# Patient Record
Sex: Female | Born: 1965 | Race: White | Hispanic: No | Marital: Married | Smoking: Never smoker
Health system: Southern US, Community
[De-identification: ages and names within clinical notes are randomized; demographics above are authoritative.]

## PROBLEM LIST (undated history)

## (undated) DIAGNOSIS — L509 Urticaria, unspecified: Secondary | ICD-10-CM

## (undated) DIAGNOSIS — F419 Anxiety disorder, unspecified: Secondary | ICD-10-CM

## (undated) DIAGNOSIS — N390 Urinary tract infection, site not specified: Secondary | ICD-10-CM

## (undated) DIAGNOSIS — G930 Cerebral cysts: Secondary | ICD-10-CM

## (undated) DIAGNOSIS — E119 Type 2 diabetes mellitus without complications: Secondary | ICD-10-CM

## (undated) DIAGNOSIS — N809 Endometriosis, unspecified: Secondary | ICD-10-CM

## (undated) DIAGNOSIS — G2581 Restless legs syndrome: Secondary | ICD-10-CM

## (undated) DIAGNOSIS — I10 Essential (primary) hypertension: Secondary | ICD-10-CM

## (undated) DIAGNOSIS — T7840XA Allergy, unspecified, initial encounter: Secondary | ICD-10-CM

## (undated) HISTORY — PX: COMBINED HYSTEROSCOPY DIAGNOSTIC / D&C: SUR297

## (undated) HISTORY — PX: CERVICAL ABLATION: SHX5771

## (undated) HISTORY — DX: Endometriosis, unspecified: N80.9

## (undated) HISTORY — DX: Urinary tract infection, site not specified: N39.0

## (undated) HISTORY — PX: DILATION AND CURETTAGE OF UTERUS: SHX78

## (undated) HISTORY — DX: Anxiety disorder, unspecified: F41.9

## (undated) HISTORY — DX: Essential (primary) hypertension: I10

## (undated) HISTORY — PX: NOVASURE ABLATION: SHX5394

## (undated) HISTORY — DX: Allergy, unspecified, initial encounter: T78.40XA

## (undated) HISTORY — DX: Cerebral cysts: G93.0

## (undated) HISTORY — PX: LAPAROSCOPIC ABDOMINAL EXPLORATION: SHX6249

## (undated) HISTORY — PX: OTHER SURGICAL HISTORY: SHX169

## (undated) HISTORY — DX: Type 2 diabetes mellitus without complications: E11.9

## (undated) HISTORY — DX: Restless legs syndrome: G25.81

## (undated) HISTORY — DX: Urticaria, unspecified: L50.9

---

## 2013-05-31 ENCOUNTER — Ambulatory Visit: Payer: BC Managed Care – PPO | Admitting: Physician Assistant

## 2013-05-31 VITALS — BP 118/72 | HR 82 | Temp 98.2°F | Resp 16 | Ht 65.0 in | Wt 251.0 lb

## 2013-05-31 DIAGNOSIS — R300 Dysuria: Secondary | ICD-10-CM

## 2013-05-31 DIAGNOSIS — R350 Frequency of micturition: Secondary | ICD-10-CM

## 2013-05-31 LAB — POCT UA - MICROSCOPIC ONLY
Crystals, Ur, HPF, POC: NEGATIVE
Yeast, UA: NEGATIVE

## 2013-05-31 LAB — POCT URINALYSIS DIPSTICK
Bilirubin, UA: NEGATIVE
Blood, UA: NEGATIVE
Glucose, UA: 500
Leukocytes, UA: NEGATIVE
Nitrite, UA: POSITIVE
Protein, UA: NEGATIVE
Spec Grav, UA: 1.02
Urobilinogen, UA: 0.2
pH, UA: 5.5

## 2013-05-31 MED ORDER — FLUCONAZOLE 150 MG PO TABS: 150.0000 mg | ORAL_TABLET | Freq: Once | ORAL | Status: DC

## 2013-05-31 MED ORDER — CIPROFLOXACIN HCL 500 MG PO TABS: 500.0000 mg | ORAL_TABLET | Freq: Two times a day (BID) | ORAL | Status: DC

## 2013-05-31 NOTE — Progress Notes (Signed)
  Subjective:    Patient ID: April Carson, female    DOB: 1966-01-27, 47 y.o.   MRN: [id number]  HPI 47 year old female presents with 3-4 day history of urinary frequency and slight right sided flank pain. She does have a significant hx of frequent UTI's in the past. Has recently given up soda which has helped decrease the frequency of her infections (last about 4 months ago).  Denies nausea, vomiting, fever, or chills. Complains of slight vaginal discharge - believes she may have a yeast infection. Has been taking Invokana and she has been told she this is a side effect. Otherwise she is tolerating it well.  Patient is from [place] - here for the next 2 months taking care of her father who is sick.      Review of Systems  Constitutional: Negative for fever and chills.  Gastrointestinal: Negative for nausea, vomiting and abdominal pain.  Genitourinary: Positive for dysuria, frequency and vaginal discharge. Negative for hematuria, flank pain, vaginal bleeding, vaginal pain and menstrual problem.       Objective:   Physical Exam  Constitutional: She is oriented to person, place, and time. She appears well-developed and well-nourished.  HENT:  Head: Normocephalic and atraumatic.  Right Ear: External ear normal.  Left Ear: External ear normal.  Eyes: Conjunctivae are normal.  Neck: Normal range of motion.  Cardiovascular: Normal rate, regular rhythm and normal heart sounds.   Pulmonary/Chest: Effort normal and breath sounds normal.  Abdominal: Soft. Bowel sounds are normal. There is no tenderness. There is no rebound, no guarding and no CVA tenderness.  Neurological: She is alert and oriented to person, place, and time.  Psychiatric: She has a normal mood and affect. Her behavior is normal. Judgment and thought content normal.     Results for orders placed in visit on 05/31/13  POCT URINALYSIS DIPSTICK      Result Value Range   Color, UA yellow     Clarity, UA clear     Glucose,  UA 500     Bilirubin, UA neg     Ketones, UA trace     Spec Grav, UA 1.020     Blood, UA neg     pH, UA 5.5     Protein, UA neg     Urobilinogen, UA 0.2     Nitrite, UA pos     Leukocytes, UA Negative    POCT UA - MICROSCOPIC ONLY      Result Value Range   WBC, Ur, HPF, POC 0-2     RBC, urine, microscopic 0-3     Bacteria, U Microscopic 3+     Mucus, UA trace     Epithelial cells, urine per micros 1-5     Crystals, Ur, HPF, POC neg     Casts, Ur, LPF, POC broad waxy     Yeast, UA neg          Assessment & Plan:  Dysuria - Plan: POCT urinalysis dipstick, POCT UA - Microscopic Only, ciprofloxacin (CIPRO) 500 MG tablet, Urine culture  Urinary frequency  Urine culture sent Start Cipro 500 mg bid x 5 days Increase fluids and rest. Try to decrease soda intake Follow up if symptoms worsen or fail to improve.

## 2013-06-02 LAB — URINE CULTURE: Colony Count: >=100000

## 2013-07-27 ENCOUNTER — Telehealth: Payer: Self-pay | Admitting: Family Medicine

## 2013-07-27 NOTE — Telephone Encounter (Signed)
Patient is April Carson & April Carson daughter, and she would like to know if you could see her as a new patient sooner than the first available appt on 09/20/13 at 9:45am.  Please advise.

## 2013-07-28 NOTE — Telephone Encounter (Signed)
 appointment when possible, prefer not Monday or Friday.  Thanks.

## 2013-07-28 NOTE — Telephone Encounter (Signed)
I spoke with patient and scheduled appointment on Thursday,Aug.21st at 11:00.

## 2013-08-03 ENCOUNTER — Ambulatory Visit: Payer: BC Managed Care – PPO | Admitting: Emergency Medicine

## 2013-08-03 VITALS — BP 124/66 | HR 80 | Temp 98.7°F | Resp 18 | Ht 64.5 in | Wt 237.0 lb

## 2013-08-03 DIAGNOSIS — J45909 Unspecified asthma, uncomplicated: Secondary | ICD-10-CM

## 2013-08-03 DIAGNOSIS — T148XXA Other injury of unspecified body region, initial encounter: Secondary | ICD-10-CM

## 2013-08-03 LAB — POCT CBC
Granulocyte percent: 73.3 %G (ref 37–80)
HCT, POC: 44.8 % (ref 37.7–47.9)
Hemoglobin: 13.9 g/dL (ref 12.2–16.2)
Lymph, poc: 3 (ref 0.6–3.4)
MCH, POC: 27.9 pg (ref 27–31.2)
MCHC: 31 g/dL — AB (ref 31.8–35.4)
MCV: 89.8 fL (ref 80–97)
MID (cbc): 0.5 (ref 0–0.9)
MPV: 8.2 fL (ref 0–99.8)
POC Granulocyte: 9.7 — AB (ref 2–6.9)
POC LYMPH PERCENT: 22.7 % (ref 10–50)
POC MID %: 4 %M (ref 0–12)
Platelet Count, POC: 297 10*3/uL (ref 142–424)
RBC: 4.99 M/uL (ref 4.04–5.48)
RDW, POC: 15.5 %
WBC: 13.2 10*3/uL — AB (ref 4.6–10.2)

## 2013-08-03 MED ORDER — FLUTICASONE-SALMETEROL 250-50 MCG/DOSE IN AEPB: 1.0000 | INHALATION_SPRAY | Freq: Two times a day (BID) | RESPIRATORY_TRACT | Status: DC
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
## 2013-08-05 ENCOUNTER — Ambulatory Visit: Payer: Self-pay | Admitting: Family Medicine

## 2013-08-12 ENCOUNTER — Encounter: Payer: Self-pay | Admitting: Family Medicine

## 2013-08-12 ENCOUNTER — Ambulatory Visit (INDEPENDENT_AMBULATORY_CARE_PROVIDER_SITE_OTHER): Payer: BC Managed Care – PPO | Admitting: Family Medicine

## 2013-08-12 VITALS — BP 116/66 | HR 82 | Temp 98.6°F | Wt 236.5 lb

## 2013-08-12 DIAGNOSIS — E119 Type 2 diabetes mellitus without complications: Secondary | ICD-10-CM

## 2013-08-12 DIAGNOSIS — Z808 Family history of malignant neoplasm of other organs or systems: Secondary | ICD-10-CM

## 2013-08-12 DIAGNOSIS — I10 Essential (primary) hypertension: Secondary | ICD-10-CM

## 2013-08-12 DIAGNOSIS — N889 Noninflammatory disorder of cervix uteri, unspecified: Secondary | ICD-10-CM

## 2013-08-12 DIAGNOSIS — N760 Acute vaginitis: Secondary | ICD-10-CM

## 2013-08-12 DIAGNOSIS — F411 Generalized anxiety disorder: Secondary | ICD-10-CM

## 2013-08-12 DIAGNOSIS — G2581 Restless legs syndrome: Secondary | ICD-10-CM

## 2013-08-12 DIAGNOSIS — J45909 Unspecified asthma, uncomplicated: Secondary | ICD-10-CM

## 2013-08-12 LAB — HEMOGLOBIN A1C: Hgb A1c MFr Bld: 6.3 % (ref 4.6–6.5)

## 2013-08-12 MED ORDER — ROPINIROLE HCL 1 MG PO TABS: 1.0000 mg | ORAL_TABLET | Freq: Two times a day (BID) | ORAL | Status: DC

## 2013-08-12 MED ORDER — LISINOPRIL 10 MG PO TABS: 10.0000 mg | ORAL_TABLET | Freq: Every day | ORAL | Status: DC

## 2013-08-12 MED ORDER — ALBUTEROL SULFATE HFA 108 (90 BASE) MCG/ACT IN AERS: 2.0000 | INHALATION_SPRAY | Freq: Four times a day (QID) | RESPIRATORY_TRACT | Status: DC | PRN

## 2013-08-12 MED ORDER — FLUCONAZOLE 150 MG PO TABS: 150.0000 mg | ORAL_TABLET | Freq: Once | ORAL | Status: DC

## 2013-08-12 NOTE — Patient Instructions (Signed)
Go to the lab on the way out.  We'll contact you with your lab report.  I would get a flu shot each fall.   April Carson will call about your referrals ([facility], [facility]).  Use the albuterol as needed, continue the advair twice a day.  We'll request your records.

## 2013-08-13 ENCOUNTER — Encounter: Payer: Self-pay | Admitting: Family Medicine

## 2013-08-13 DIAGNOSIS — Z808 Family history of malignant neoplasm of other organs or systems: Secondary | ICD-10-CM | POA: Insufficient documentation

## 2013-08-13 DIAGNOSIS — I10 Essential (primary) hypertension: Secondary | ICD-10-CM | POA: Insufficient documentation

## 2013-08-13 DIAGNOSIS — J45909 Unspecified asthma, uncomplicated: Secondary | ICD-10-CM | POA: Insufficient documentation

## 2013-08-13 DIAGNOSIS — N760 Acute vaginitis: Secondary | ICD-10-CM | POA: Insufficient documentation

## 2013-08-13 DIAGNOSIS — F411 Generalized anxiety disorder: Secondary | ICD-10-CM | POA: Insufficient documentation

## 2013-08-13 DIAGNOSIS — G2581 Restless legs syndrome: Secondary | ICD-10-CM | POA: Insufficient documentation

## 2013-08-13 DIAGNOSIS — N889 Noninflammatory disorder of cervix uteri, unspecified: Secondary | ICD-10-CM | POA: Insufficient documentation

## 2013-08-13 DIAGNOSIS — E119 Type 2 diabetes mellitus without complications: Secondary | ICD-10-CM | POA: Insufficient documentation

## 2013-08-13 NOTE — Assessment & Plan Note (Signed)
Controlled, continue current meds.   

## 2013-08-13 NOTE — Assessment & Plan Note (Signed)
Refer to derm  ?

## 2013-08-13 NOTE — Assessment & Plan Note (Signed)
-   Continue SSRI 

## 2013-08-13 NOTE — Progress Notes (Signed)
Diabetes:  Using medications without difficulties:yes but with vaginal yeast sx with current meds.   Hypoglycemic episodes:no Hyperglycemic episodes:no Feet problems:no Blood Sugars averaging: controlled if diet adherent Due for A1c.   H/o anxiety controlled with SSRI.  No ADE.   RLS controlled with current meds. No ADE.   FH melanoma, wanted derm referral. No new skin lesion.   H/o cervical ablation, requesting gyn referral.   Hypertension:    Using medication without problems or lightheadedness: yes Chest pain with exertion:no Edema:no Short of breath:see below  H/o mold and dust exposure. Recently started on advair for chest tightness with environmental exposures.  Trying to get mold/dust cleaned up in the house.  No h/o asthma.   PMH and SH reviewed  Meds, vitals, and allergies reviewed.   ROS: See HPI.  Otherwise negative.    GEN: nad, alert and oriented HEENT: mucous membranes moist NECK: supple w/o LA CV: rrr. PULM: ctab, no inc wob ABD: soft, +bs EXT: no edema SKIN: no acute rash  Diabetic foot exam: Normal inspection No skin breakdown No calluses  Normal DP pulses Normal sensation to light touch and monofilament Nails normal

## 2013-08-13 NOTE — Assessment & Plan Note (Signed)
See notes on labs, requesting records.

## 2013-08-13 NOTE — Assessment & Plan Note (Signed)
Continue current meds, requesting records.  

## 2013-08-13 NOTE — Assessment & Plan Note (Signed)
Diflucan for now, d/w pt about limiting sugar intake.

## 2013-08-13 NOTE — Assessment & Plan Note (Signed)
Refer, requesting records.

## 2013-08-13 NOTE — Assessment & Plan Note (Signed)
saba prn, continue advair for now.  She is working to get the house cleaned up.

## 2013-08-20 ENCOUNTER — Encounter: Payer: Self-pay | Admitting: Family Medicine

## 2013-08-23 ENCOUNTER — Ambulatory Visit (INDEPENDENT_AMBULATORY_CARE_PROVIDER_SITE_OTHER): Payer: BC Managed Care – PPO | Admitting: Family Medicine

## 2013-08-23 ENCOUNTER — Encounter: Payer: Self-pay | Admitting: Family Medicine

## 2013-08-23 VITALS — BP 122/70 | HR 68 | Temp 98.0°F | Wt 236.5 lb

## 2013-08-23 DIAGNOSIS — R930 Abnormal findings on diagnostic imaging of skull and head, not elsewhere classified: Secondary | ICD-10-CM

## 2013-08-23 DIAGNOSIS — R9089 Other abnormal findings on diagnostic imaging of central nervous system: Secondary | ICD-10-CM
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
## 2013-08-23 NOTE — Patient Instructions (Addendum)
Rest for now.  Limit activity, reading, noise, bright lights, etc.  We'll get your records and then likely order the MRI.  Take care.

## 2013-08-24 DIAGNOSIS — R9089 Other abnormal findings on diagnostic imaging of central nervous system: Secondary | ICD-10-CM | POA: Insufficient documentation

## 2013-08-24 DIAGNOSIS — M791 Myalgia, unspecified site: Secondary | ICD-10-CM | POA: Insufficient documentation

## 2013-08-24 NOTE — Assessment & Plan Note (Addendum)
Requesting records, we may need MRI after records reviewed. >25 min spent with face to face with patient, >50% counseling and/or coordinating care.

## 2013-08-24 NOTE — Assessment & Plan Note (Signed)
No LOC, requesting records.  Back pain appears to have benign source and should improve.

## 2013-08-25 ENCOUNTER — Telehealth: Payer: Self-pay | Admitting: Family Medicine

## 2013-08-25 DIAGNOSIS — R9389 Abnormal findings on diagnostic imaging of other specified body structures: Secondary | ICD-10-CM

## 2013-08-25 NOTE — Telephone Encounter (Signed)
Unable to reach patient by telephone. Spoke to patient's husband and he will contact her and have her call the office Thursday morning.

## 2013-08-25 NOTE — Telephone Encounter (Signed)
Please call pt.  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  This needs extra imaging, MRI with contrast.  Assuming this is a cyst, then it isn't cancerous.  However, it would still be reasonable to have her see neurosurgery after the CT is done.   I want their input on follow up, ie should this be re-imaged later on.  I also want them to comment on if she would need any treatment for it now.    I put in the referral for the MRI and neurosurgery.   

## 2013-08-25 NOTE — Telephone Encounter (Signed)
Please call pt.  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  This needs extra imaging, MRI with contrast.  Assuming this is a cyst, then it isn't cancerous.  However, it would still be reasonable to have her see neurosurgery after the CT is done.   I want their input on follow up, ie should this be re-imaged later on.  I also want them to comment on if she would need any treatment for it now.    I put in the referral for the MRI and neurosurgery.

## 2013-08-26 NOTE — Telephone Encounter (Signed)
Patient advised.

## 2013-08-29 ENCOUNTER — Encounter: Payer: Self-pay | Admitting: Family Medicine

## 2013-08-29 ENCOUNTER — Telehealth: Payer: Self-pay | Admitting: Family Medicine

## 2013-08-29 NOTE — Telephone Encounter (Signed)
Call pt. I got old records. I didn't see anything about her last Pap, Tetanus, PNA vaccine, or eye exam.  We can address those as we go along.  Thanks.

## 2013-08-30 ENCOUNTER — Encounter: Payer: BC Managed Care – PPO | Admitting: Obstetrics & Gynecology

## 2013-08-30 NOTE — Telephone Encounter (Signed)
MRI isn't emergent. Would push back the MRI until she is back in town.  Have her see if she can reschedule.  I wish her and her daughter well.  Thanks .

## 2013-08-30 NOTE — Telephone Encounter (Signed)
Left detailed message on voicemail.  

## 2013-08-30 NOTE — Telephone Encounter (Signed)
Patient notified as instructed by telephone. Was advised that she is at [facility] with her daughter and will be there probably at least thru Wednesday. Patient wants to know if she should try to have the MRI at [facility] or is it okay to wait until she is back in town to have the test. Patient states that she is scheduled Wednesday for the MRI. Please advise.

## 2013-09-01 ENCOUNTER — Other Ambulatory Visit: Payer: BC Managed Care – PPO

## 2013-09-02 ENCOUNTER — Encounter: Payer: Self-pay | Admitting: Family Medicine

## 2013-09-02 ENCOUNTER — Ambulatory Visit (INDEPENDENT_AMBULATORY_CARE_PROVIDER_SITE_OTHER): Payer: BC Managed Care – PPO | Admitting: Family Medicine

## 2013-09-02 VITALS — BP 124/64 | HR 70 | Temp 98.4°F | Wt 234.2 lb

## 2013-09-02 DIAGNOSIS — R9089 Other abnormal findings on diagnostic imaging of central nervous system: Secondary | ICD-10-CM

## 2013-09-02 DIAGNOSIS — R930 Abnormal findings on diagnostic imaging of skull and head, not elsewhere classified: Secondary | ICD-10-CM

## 2013-09-02 DIAGNOSIS — Z23 Encounter for immunization: Secondary | ICD-10-CM

## 2013-09-02 NOTE — Patient Instructions (Addendum)
I have heard good things about Genene Churn (chiropractor in [place]) [phone] Ice/heat/gentle stretching/ibuprofen with food.  This should improve.  Take care.
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
## 2013-09-03 NOTE — Assessment & Plan Note (Signed)
D/w pt.  Fu MRI and neurosurg ref pending .

## 2013-09-03 NOTE — Assessment & Plan Note (Signed)
With muscle pain likely worsened again after sleeping in a chair at the hospital.  D/w pt about heat, ice, stretching, chiropractor and ibuprofen.  Fu prn.  Should resolve.

## 2013-09-05 ENCOUNTER — Ambulatory Visit
Admission: RE | Admit: 2013-09-05 | Discharge: 2013-09-05 | Disposition: A | Discharge status: Home / Self Care | Payer: Medicaid Other | Source: Ambulatory Visit | Attending: Family Medicine | Admitting: Family Medicine

## 2013-09-05 DIAGNOSIS — R9389 Abnormal findings on diagnostic imaging of other specified body structures: Secondary | ICD-10-CM

## 2013-09-05 MED ORDER — GADOBENATE DIMEGLUMINE 529 MG/ML IV SOLN
20.0000 mL | Freq: Once | INTRAVENOUS | Status: AC | PRN
  Administered 2013-09-05: 20 mL via INTRAVENOUS

## 2013-09-20 ENCOUNTER — Ambulatory Visit: Payer: Self-pay | Admitting: Family Medicine

## 2013-09-24 ENCOUNTER — Other Ambulatory Visit: Payer: Self-pay

## 2013-09-24 DIAGNOSIS — Z1231 Encounter for screening mammogram for malignant neoplasm of breast: Secondary | ICD-10-CM

## 2013-10-05 ENCOUNTER — Ambulatory Visit
Admission: RE | Admit: 2013-10-05 | Discharge: 2013-10-05 | Disposition: A | Discharge status: Home / Self Care | Payer: Medicaid Other | Source: Ambulatory Visit

## 2013-10-05 DIAGNOSIS — Z1231 Encounter for screening mammogram for malignant neoplasm of breast: Secondary | ICD-10-CM

## 2013-10-14 LAB — HM DIABETES EYE EXAM

## 2013-10-19 ENCOUNTER — Encounter: Payer: Self-pay | Admitting: Family Medicine

## 2013-11-01 ENCOUNTER — Other Ambulatory Visit: Payer: Self-pay

## 2013-11-01 MED ORDER — FLUOXETINE HCL 20 MG PO CAPS: 20.0000 mg | ORAL_CAPSULE | Freq: Every day | ORAL | Status: DC

## 2013-11-01 NOTE — Telephone Encounter (Signed)
Pt left note requesting refill fluoxetine 20 mg; pt has not had filled since Dr Rudell Cobb from [facility] 09/17/13 [facility] [address]. Pt said does not need today.

## 2013-11-01 NOTE — Telephone Encounter (Signed)
Sent!

## 2013-12-28 ENCOUNTER — Encounter: Payer: Self-pay | Admitting: Family Medicine

## 2013-12-28 ENCOUNTER — Ambulatory Visit (INDEPENDENT_AMBULATORY_CARE_PROVIDER_SITE_OTHER): Payer: BC Managed Care – PPO | Admitting: Family Medicine

## 2013-12-28 VITALS — BP 124/72 | HR 80 | Temp 98.4°F | Wt 224.0 lb

## 2013-12-28 DIAGNOSIS — R350 Frequency of micturition: Secondary | ICD-10-CM | POA: Insufficient documentation

## 2013-12-28 DIAGNOSIS — R42 Dizziness and giddiness: Secondary | ICD-10-CM | POA: Insufficient documentation

## 2013-12-28 LAB — POCT URINALYSIS DIPSTICK
Bilirubin, UA: NEGATIVE
Glucose, UA: 1000
Ketones, UA: NEGATIVE
Leukocytes, UA: NEGATIVE
Nitrite, UA: NEGATIVE
Spec Grav, UA: 1.025
Urobilinogen, UA: 0.2
pH, UA: 6

## 2013-12-28 NOTE — Assessment & Plan Note (Signed)
UA/micro negative for infection. Glucosuria attributed to invokana.

## 2013-12-28 NOTE — Assessment & Plan Note (Addendum)
Anticipate peripheral cause of vertigo given exam today - anticipate BPPV and provided with modified epley to do at home. Recommended dramamine to use prior to plane ride but pt aware trip may increase risk of recurrence. Exam not consistent with central cause today - doubt cyst related - but advised pt to update us if not improving with above.

## 2013-12-28 NOTE — Patient Instructions (Signed)
I don't think this is due to the colloid cyst in your foramin of monroe. I think this is more likely due to benign positional vertigo (BPV). May use dramamine as needed and stretching exercises provided today to help decrease frequency of vertigo. Let us know if not improving with this.

## 2013-12-28 NOTE — Progress Notes (Signed)
Pre-visit discussion using our clinic review tool. No additional management support is needed unless otherwise documented below in the visit note.  

## 2013-12-28 NOTE — Progress Notes (Signed)
   Subjective:    Patient ID: April Carson, female    DOB: Apr 25, 1966, 48 y.o.   MRN: [id number]  HPI CC: dizziness  Presents 15 min late with mother to appt today.  5d h/o feeling very dizzy - describes vertigo sensation that lasted for several hours (minutes at a time until perfectly still) - stayed in bed all day.  Next day also very dizzy.  Slowly improving.  + nausea with this and chills. No significant headache, no vomiting, hearing loss, tinnitus.  No ear pain. No dyspnea, chest pain or palpitations No recent cold sxs.   Did have allergy flare with cleaning out parent's house recently.  Planning plane ride tomorrow - wanted to be checked out priro to visit. 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  Saw Dr Para Marchuncan then neurology for this -decided against surgery.  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  Also having increase urgency, frequency and dysuria.  Mild flank pain.  No hematuria (but has longstanding history of microhematuria with negative evaluation per patient), fevers/chills.  Father with h/o meniere's dz  Lab Results  Component Value Date   HGBA1C 6.3 08/12/2013    Past Medical History  Diagnosis Date  . Diabetes mellitus without complication   . Hypertension   . RLS (restless legs syndrome)   . Allergy   . Anxiety   . Endometriosis   . Recurrent UTI     history of    Review of Systems Per HPI    Objective:   Physical Exam  Vitals reviewed. Constitutional: She appears well-developed and well-nourished. No distress.  HENT:  Head: Normocephalic and atraumatic.  Right Ear: Hearing, tympanic membrane, external ear and ear canal normal.  Left Ear: Hearing, tympanic membrane, external ear and ear canal normal.  Nose: Right sinus exhibits no maxillary sinus tenderness and no frontal sinus tenderness. Left sinus exhibits no maxillary sinus tenderness and no frontal sinus tenderness.  Mouth/Throat: Uvula is midline,  oropharynx is clear and moist and mucous membranes are normal. No oropharyngeal exudate, posterior oropharyngeal edema, posterior oropharyngeal erythema or tonsillar abscesses.  Eyes: Conjunctivae and EOM are normal. Pupils are equal, round, and reactive to light. No scleral icterus.  Neck: Normal range of motion. Neck supple. No JVD present. No thyromegaly present.  Cardiovascular: Normal rate, regular rhythm, normal heart sounds and intact distal pulses.   No murmur heard. Pulmonary/Chest: Effort normal and breath sounds normal. No respiratory distress. She has no wheezes. She has no rales.  Abdominal: Soft. Bowel sounds are normal. She exhibits no distension and no mass. There is no hepatosplenomegaly. There is no tenderness. There is no rebound, no guarding and no CVA tenderness.  Lymphadenopathy:    She has no cervical adenopathy.  Neurological: No cranial nerve deficit or sensory deficit. Coordination and gait normal.  CN 2-12 intact Intact FTN Slight vertigo without significant nystagmus with dix hallpike to right, normal dix hallpike to left  Skin: Skin is warm and dry. No rash noted.       Assessment & Plan:

## 2014-01-07 ENCOUNTER — Encounter: Payer: Self-pay | Admitting: Family Medicine

## 2014-01-07 ENCOUNTER — Ambulatory Visit (INDEPENDENT_AMBULATORY_CARE_PROVIDER_SITE_OTHER): Payer: BC Managed Care – PPO | Admitting: Family Medicine

## 2014-01-07 VITALS — BP 126/76 | HR 96 | Temp 98.4°F | Wt 224.5 lb

## 2014-01-07 DIAGNOSIS — H6980 Other specified disorders of Eustachian tube, unspecified ear: Secondary | ICD-10-CM

## 2014-01-07 DIAGNOSIS — H6990 Unspecified Eustachian tube disorder, unspecified ear: Secondary | ICD-10-CM

## 2014-01-07 MED ORDER — AMOXICILLIN-POT CLAVULANATE 875-125 MG PO TABS: 1.0000 | ORAL_TABLET | Freq: Two times a day (BID) | ORAL | Status: DC

## 2014-01-07 NOTE — Progress Notes (Signed)
Pre-visit discussion using our clinic review tool. No additional management support is needed unless otherwise documented below in the visit note.  Prev seen, note reviewed.  Vertigo resolved.  Able to fly to [place] w/o troubles.  Flying to [place] in 6 days.  ~2 days of ear pain, stuffy.  B pain, L>R.  HA, frontal.  No FNAVD, occ chills. Some rhinorrhea, more stuffy.  Some cough, mild.  Bloody nasal discharge. Sugar has been controlled.   Meds, vitals, and allergies reviewed.   ROS: See HPI.  Otherwise, noncontributory.  GEN: nad, alert and oriented HEENT: mucous membranes moist, tm w/o erythema, nasal exam w/o erythema, clear discharge noted,  OP with cobblestoning, poor TM movement on valsalva NECK: supple w/o LA CV: rrr.   PULM: ctab, no inc wob EXT: no edema SKIN: no acute rash

## 2014-01-07 NOTE — Patient Instructions (Signed)
Use an OTC decongestant and start the antibiotics in a few day if not improving or you have more facial pain. Take care.

## 2014-01-09 DIAGNOSIS — H6980 Other specified disorders of Eustachian tube, unspecified ear: Secondary | ICD-10-CM | POA: Insufficient documentation

## 2014-01-09 DIAGNOSIS — H6990 Unspecified Eustachian tube disorder, unspecified ear: Secondary | ICD-10-CM | POA: Insufficient documentation

## 2014-01-09 NOTE — Assessment & Plan Note (Signed)
No sign of AOM. Would hold abx for now, use if profound facial pain in the next few days.  Decongestants o/w in meantime, d/w pt about anatomy.  She agrees with plan. Fu prn.

## 2014-01-10 ENCOUNTER — Telehealth: Payer: Self-pay

## 2014-01-10 NOTE — Telephone Encounter (Signed)
Triage Record Num: [id number] Operator: Al CorpusKristen Binkney Patient Name: April Carson Call Date & Time: 01/08/2014 8:04:22AM Patient Phone: [phone] PCP: Crawford GivensGraham Duncan Patient Gender: Female PCP Fax : Patient DOB: [birth year] [facility] Reason for Call: Caller: [name]; PCP: Crawford Givensuncan, Graham [name] Practice); CB#: [phone]; Call regarding Right eye reddened/pink sclera with yellow/green discharge with pink sclera onset yesterday 1/23 and discharge onset today 1/24. Woke up with eyelashes stuck together. Was around a co-worker with pink eye within the last few days. Per "Eye Infection or irritation" protocol, all emergent sxs r/o w/exception of "New onset of eye redness, irritation... or gritty feeling with yellow/green discharge." Denies eye pain or severe lid redness or lid swelling or any other sxs. Disp of see provider within 24 hrs. However, able to call in script per office policy/standing orders. Called in Polytrim eye gtts--2 gtts both eyes QID x 5 days, disp #1 bottle, no RF to pts [facility]([address] in [place]) @ [phone]. Pharm not open yet this morning (opens in less than an hour), so script left on pharm vm per instructions. Pt aware script called in and also aware to call back if she develops any new or worsening sxs or if does not begin to have some relief of sxs in approx 24 hrs. Advised she would need to see provider in that case. Agreed to plan. Protocol(s) Used: Eye: Infection or Irritation Recommended Outcome per Protocol: See Provider within 24 hours Reason for Outcome: New onset of eye redness, irritation/foreign body sensation or gritty feeling with yellow/green drainage Care Advice: Call provider if symptoms get worse, or you develop increasing eye pain, changes in vision, or blisters or sores on eye or insides of eyelids. ~ ~ SYMPTOM / CONDITION MANAGEMENT ~ INFECTION CONTROL ~ CAUTIONS

## 2014-01-10 NOTE — Telephone Encounter (Signed)
Noted, thanks!

## 2014-02-07 ENCOUNTER — Encounter: Payer: Self-pay | Admitting: Internal Medicine

## 2014-02-07 ENCOUNTER — Ambulatory Visit (INDEPENDENT_AMBULATORY_CARE_PROVIDER_SITE_OTHER): Payer: BC Managed Care – PPO | Admitting: Internal Medicine

## 2014-02-07 VITALS — BP 128/76 | HR 80 | Temp 98.3°F | Wt 221.8 lb

## 2014-02-07 DIAGNOSIS — J309 Allergic rhinitis, unspecified: Secondary | ICD-10-CM

## 2014-02-07 DIAGNOSIS — R059 Cough, unspecified: Secondary | ICD-10-CM

## 2014-02-07 DIAGNOSIS — H6990 Unspecified Eustachian tube disorder, unspecified ear: Secondary | ICD-10-CM

## 2014-02-07 DIAGNOSIS — H6980 Other specified disorders of Eustachian tube, unspecified ear: Secondary | ICD-10-CM

## 2014-02-07 DIAGNOSIS — R05 Cough: Secondary | ICD-10-CM

## 2014-02-07 MED ORDER — HYDROCODONE-HOMATROPINE 5-1.5 MG/5ML PO SYRP: 5.0000 mL | ORAL_SOLUTION | Freq: Three times a day (TID) | ORAL | Status: DC | PRN

## 2014-02-07 MED ORDER — FLUTICASONE PROPIONATE 50 MCG/ACT NA SUSP: 2.0000 | Freq: Every day | NASAL | Status: DC

## 2014-02-07 NOTE — Progress Notes (Signed)
HPI  Pt presents to the clinic today with c/o cough. This started 1 month ago but seems to be getting worse. The cough is uncproductive, worse at night. She also c/o ear fullness, L>R and sore throat. This started yesterday. She was seen 1 month ago for eustachian tube dysfunction and facial pressure. She did take Augmentin which did provide some relief. She has taken OTC thera flu, cough suppressant, aleve, allegra and her inhaler. None of the OTC medication provided much relief. She does report that the place she is staying has a lot of black mold. She does report that she seems to be having recurrent URI symptoms since moving into this house. She does plan to move out next week. She does have a history of allergies. She has not had sick contacts that she is aware of.  Review of Systems      Past Medical History  Diagnosis Date  . Diabetes mellitus without complication   . Hypertension   . RLS (restless legs syndrome)   . Allergy   . Anxiety   . Endometriosis   . Recurrent UTI     history of    Family History  Problem Relation Age of Onset  . Diabetes Mother   . Cancer Father     melanoma  . Heart attack Maternal Grandmother   . Heart attack Maternal Grandfather   . Cancer Paternal Grandmother   . Heart attack Paternal Grandfather     History   Social History  . Marital Status: Married    Spouse Name: N/A    Number of Children: N/A  . Years of Education: N/A   Occupational History  . Not on file.   Social History Main Topics  . Smoking status: Never Smoker   . Smokeless tobacco: Not on file  . Alcohol Use: No  . Drug Use: No  . Sexual Activity: Yes    Birth Control/ Protection: Condom   Other Topics Concern  . Not on file   Social History Narrative   Separated 2014   2 kids   Moved to [facility] 2014 when father was ill with cancer    No Known Allergies   Constitutional: Denies headache, fatigue, fever or abrupt weight changes.  HEENT:  Positive ear pain,  runny nose, sore throat. Denies eye redness, eye pain, pressure behind the eyes, facial pain, nasal congestion, ringing in the ears, wax buildup, or bloody nose. Respiratory: Positive cough. Denies difficulty breathing or shortness of breath.  Cardiovascular: Denies chest pain, chest tightness, palpitations or swelling in the hands or feet.   No other specific complaints in a complete review of systems (except as listed in HPI above).  Objective:   BP 128/76  Pulse 80  Temp(Src) 98.3 F (36.8 C) (Oral)  Wt 221 lb 12 oz (100.585 kg)  SpO2 98% Wt Readings from Last 3 Encounters:  02/07/14 221 lb 12 oz (100.585 kg)  01/07/14 224 lb 8 oz (101.833 kg)  12/28/13 224 lb (101.606 kg)     General: Appears her stated age, obese but well developed, well nourished in NAD. HEENT: Head: normal shape and size; Eyes: sclera white, no icterus, conjunctiva pink, PERRLA and EOMs intact; Ears: Tm's gray and intact, normal light reflex; Nose: mucosa pink and moist, septum midline; Throat/Mouth: + PND. Teeth present, mucosa erythematous and moist, no exudate noted, no lesions or ulcerations noted.  Neck:  Neck supple, trachea midline. No massses, lumps or thyromegaly present.  Cardiovascular: Normal rate and rhythm.  S1,S2 noted.  No murmur, rubs or gallops noted. No JVD or BLE edema. No carotid bruits noted. Pulmonary/Chest: Normal effort and positive vesicular breath sounds. No respiratory distress. No wheezes, rales or ronchi noted.      Assessment & Plan:   Cough secondary to PND likely due to mold allergy:  Get some rest and drink plenty of water Do salt water gargles for the sore throat Continue allegra at night, Ok to take Claritin in am as well x 1 week eRx for [employer] BrandsFlonase eRx for Hycodan I advised her that symptoms will likely not improve much until she is away from the black mold If symptoms persist once she has moved, can consider allergy testing  ETD:  Continue allegra at night, OK to take  Claritin in am as well x 1 week Erx for Flonase  RTC as needed or if symptoms persist.

## 2014-02-07 NOTE — Progress Notes (Signed)
Pre visit review using our clinic review tool, if applicable. No additional management support is needed unless otherwise documented below in the visit note. 

## 2014-02-07 NOTE — Patient Instructions (Addendum)

## 2014-02-09 ENCOUNTER — Telehealth: Payer: Self-pay

## 2014-02-09 NOTE — Telephone Encounter (Signed)
Pt is aware as instructed 

## 2014-02-09 NOTE — Telephone Encounter (Signed)
An antibiotic is not going to help her mold allergy. She needs to continue with the treatment discussed in the OV. If persist > 1-2 weeks after moving she needs to return for followup

## 2014-02-09 NOTE — Telephone Encounter (Signed)
Pt left v/m; pt was seen 02/07/14 and pt has moved from her parent's house that has black mold; pt having problems breathing and hurts when pt coughs and pt wants to get antibiotic to speed healing process. Pt request cb.

## 2014-03-29 ENCOUNTER — Other Ambulatory Visit: Payer: Self-pay | Admitting: Family Medicine

## 2014-08-03 ENCOUNTER — Encounter (HOSPITAL_BASED_OUTPATIENT_CLINIC_OR_DEPARTMENT_OTHER): Payer: Self-pay | Admitting: Emergency Medicine

## 2014-08-03 ENCOUNTER — Emergency Department (HOSPITAL_BASED_OUTPATIENT_CLINIC_OR_DEPARTMENT_OTHER): Payer: Medicaid Other

## 2014-08-03 ENCOUNTER — Emergency Department (HOSPITAL_BASED_OUTPATIENT_CLINIC_OR_DEPARTMENT_OTHER)
Admission: EM | Admit: 2014-08-03 | Discharge: 2014-08-03 | Disposition: A | Discharge status: Home / Self Care | Payer: Medicaid Other | Attending: Emergency Medicine | Admitting: Emergency Medicine

## 2014-08-03 DIAGNOSIS — W06XXXA Fall from bed, initial encounter: Secondary | ICD-10-CM | POA: Insufficient documentation

## 2014-08-03 DIAGNOSIS — S298XXA Other specified injuries of thorax, initial encounter: Secondary | ICD-10-CM | POA: Insufficient documentation

## 2014-08-03 DIAGNOSIS — Z8709 Personal history of other diseases of the respiratory system: Secondary | ICD-10-CM | POA: Insufficient documentation

## 2014-08-03 DIAGNOSIS — F411 Generalized anxiety disorder: Secondary | ICD-10-CM | POA: Diagnosis not present

## 2014-08-03 DIAGNOSIS — Y9289 Other specified places as the place of occurrence of the external cause: Secondary | ICD-10-CM | POA: Diagnosis not present

## 2014-08-03 DIAGNOSIS — Z87448 Personal history of other diseases of urinary system: Secondary | ICD-10-CM | POA: Diagnosis not present

## 2014-08-03 DIAGNOSIS — S43102A Unspecified dislocation of left acromioclavicular joint, initial encounter: Secondary | ICD-10-CM

## 2014-08-03 DIAGNOSIS — I10 Essential (primary) hypertension: Secondary | ICD-10-CM | POA: Diagnosis not present

## 2014-08-03 DIAGNOSIS — Y9389 Activity, other specified: Secondary | ICD-10-CM | POA: Insufficient documentation

## 2014-08-03 DIAGNOSIS — S0990XA Unspecified injury of head, initial encounter: Secondary | ICD-10-CM | POA: Diagnosis present

## 2014-08-03 DIAGNOSIS — IMO0002 Reserved for concepts with insufficient information to code with codable children: Secondary | ICD-10-CM | POA: Insufficient documentation

## 2014-08-03 DIAGNOSIS — M25512 Pain in left shoulder: Secondary | ICD-10-CM

## 2014-08-03 DIAGNOSIS — Z87440 Personal history of urinary (tract) infections: Secondary | ICD-10-CM | POA: Diagnosis not present

## 2014-08-03 DIAGNOSIS — E119 Type 2 diabetes mellitus without complications: Secondary | ICD-10-CM | POA: Insufficient documentation

## 2014-08-03 DIAGNOSIS — S060X0A Concussion without loss of consciousness, initial encounter: Secondary | ICD-10-CM | POA: Diagnosis not present

## 2014-08-03 DIAGNOSIS — Z79899 Other long term (current) drug therapy: Secondary | ICD-10-CM | POA: Insufficient documentation

## 2014-08-03 DIAGNOSIS — G2581 Restless legs syndrome: Secondary | ICD-10-CM | POA: Insufficient documentation

## 2014-08-03 DIAGNOSIS — S42033A Displaced fracture of lateral end of unspecified clavicle, initial encounter for closed fracture: Secondary | ICD-10-CM | POA: Diagnosis not present

## 2014-08-03 MED ORDER — MORPHINE SULFATE 4 MG/ML IJ SOLN
4.0000 mg | Freq: Once | INTRAMUSCULAR | Status: DC
  Filled 2014-08-03: qty 1

## 2014-08-03 MED ORDER — CYCLOBENZAPRINE HCL 10 MG PO TABS: 10.0000 mg | ORAL_TABLET | Freq: Two times a day (BID) | ORAL | Status: DC | PRN

## 2014-08-03 MED ORDER — MORPHINE SULFATE 4 MG/ML IJ SOLN: 4.0000 mg | Freq: Once | INTRAMUSCULAR | Status: DC

## 2014-08-03 MED ORDER — OXYCODONE-ACETAMINOPHEN 5-325 MG PO TABS
1.0000 | ORAL_TABLET | Freq: Once | ORAL | Status: AC
  Administered 2014-08-03: 1 via ORAL
  Filled 2014-08-03: qty 1

## 2014-08-03 MED ORDER — HYDROCODONE-ACETAMINOPHEN 5-325 MG PO TABS: 1.0000 | ORAL_TABLET | Freq: Four times a day (QID) | ORAL | Status: DC | PRN

## 2014-08-03 NOTE — Discharge Instructions (Signed)
Concussion A concussion, or closed-head injury, is a brain injury caused by a direct blow to the head or by a quick and sudden movement (jolt) of the head or neck. Concussions are usually not life-threatening. Even so, the effects of a concussion can be serious. If you have had a concussion before, you are more likely to experience concussion-like symptoms after a direct blow to the head.  CAUSES  Direct blow to the head, such as from running into another player during a soccer game, being hit in a fight, or hitting your head on a hard surface.  A jolt of the head or neck that causes the brain to move back and forth inside the skull, such as in a car crash. SIGNS AND SYMPTOMS The signs of a concussion can be hard to notice. Early on, they may be missed by you, family members, and health care providers. You may look fine but act or feel differently. Symptoms are usually temporary, but they may last for days, weeks, or even longer. Some symptoms may appear right away while others may not show up for hours or days. Every head injury is different. Symptoms include:  Mild to moderate headaches that will not go away.  A feeling of pressure inside your head.  Having more trouble than usual:  Learning or remembering things you have heard.  Answering questions.  Paying attention or concentrating.  Organizing daily tasks.  Making decisions and solving problems.  Slowness in thinking, acting or reacting, speaking, or reading.  Getting lost or being easily confused.  Feeling tired all the time or lacking energy (fatigued).  Feeling drowsy.  Sleep disturbances.  Sleeping more than usual.  Sleeping less than usual.  Trouble falling asleep.  Trouble sleeping (insomnia).  Loss of balance or feeling lightheaded or dizzy.  Nausea or vomiting.  Numbness or tingling.  Increased sensitivity to:  Sounds.  Lights.  Distractions.  Vision problems or eyes that tire  easily.  Diminished sense of taste or smell.  Ringing in the ears.  Mood changes such as feeling sad or anxious.  Becoming easily irritated or angry for little or no reason.  Lack of motivation.  Seeing or hearing things other people do not see or hear (hallucinations). DIAGNOSIS Your health care provider can usually diagnose a concussion based on a description of your injury and symptoms. He or she will ask whether you passed out (lost consciousness) and whether you are having trouble remembering events that happened right before and during your injury. Your evaluation might include:  A brain scan to look for signs of injury to the brain. Even if the test shows no injury, you may still have a concussion.  Blood tests to be sure other problems are not present. TREATMENT  Concussions are usually treated in an emergency department, in urgent care, or at a clinic. You may need to stay in the hospital overnight for further treatment.  Tell your health care provider if you are taking any medicines, including prescription medicines, over-the-counter medicines, and natural remedies. Some medicines, such as blood thinners (anticoagulants) and aspirin, may increase the chance of complications. Also tell your health care provider whether you have had alcohol or are taking illegal drugs. This information may affect treatment.  Your health care provider will send you home with important instructions to follow.  How fast you will recover from a concussion depends on many factors. These factors include how severe your concussion is, what part of your brain was injured, your  age, and how healthy you were before the concussion. °· Most people with mild injuries recover fully. Recovery can take time. In general, recovery is slower in older persons. Also, persons who have had a concussion in the past or have other medical problems may find that it takes longer to recover from their current injury. °HOME  CARE INSTRUCTIONS °General Instructions °· Carefully follow the directions your health care provider gave you. °· Only take over-the-counter or prescription medicines for pain, discomfort, or fever as directed by your health care provider. °· Take only those medicines that your health care provider has approved. °· Do not drink alcohol until your health care provider says you are well enough to do so. Alcohol and certain other drugs may slow your recovery and can put you at risk of further injury. °· If it is harder than usual to remember things, write them down. °· If you are easily distracted, try to do one thing at a time. For example, do not try to watch TV while fixing dinner. °· Talk with family members or close friends when making important decisions. °· Keep all follow-up appointments. Repeated evaluation of your symptoms is recommended for your recovery. °· Watch your symptoms and tell others to do the same. Complications sometimes occur after a concussion. Older adults with a brain injury may have a higher risk of serious complications, such as a blood clot on the brain. °· Tell your teachers, school nurse, school counselor, coach, athletic trainer, or work manager about your injury, symptoms, and restrictions. Tell them about what you can or cannot do. They should watch for: °¨ Increased problems with attention or concentration. °¨ Increased difficulty remembering or learning new information. °¨ Increased time needed to complete tasks or assignments. °¨ Increased irritability or decreased ability to cope with stress. °¨ Increased symptoms. °· Rest. Rest helps the brain to heal. Make sure you: °¨ Get plenty of sleep at night. Avoid staying up late at night. °¨ Keep the same bedtime hours on weekends and weekdays. °¨ Rest during the day. Take daytime naps or rest breaks when you feel tired. °· Limit activities that require a lot of thought or concentration. These include: °¨ Doing homework or job-related  work. °¨ Watching TV. °¨ Working on the computer. °· Avoid any situation where there is potential for another head injury (football, hockey, soccer, basketball, martial arts, downhill snow sports and horseback riding). Your condition will get worse every time you experience a concussion. You should avoid these activities until you are evaluated by the appropriate follow-up health care providers. °Returning To Your Regular Activities °You will need to return to your normal activities slowly, not all at once. You must give your body and brain enough time for recovery. °· Do not return to sports or other athletic activities until your health care provider tells you it is safe to do so. °· Ask your health care provider when you can drive, ride a bicycle, or operate heavy machinery. Your ability to react may be slower after a brain injury. Never do these activities if you are dizzy. °· Ask your health care provider about when you can return to work or school. °Preventing Another Concussion °It is very important to avoid another brain injury, especially before you have recovered. In rare cases, another injury can lead to permanent brain damage, brain swelling, or death. The risk of this is greatest during the first 7-10 days after a head injury. Avoid injuries by: °· Wearing a seat   belt when riding in a car.  Drinking alcohol only in moderation.  Wearing a helmet when biking, skiing, skateboarding, skating, or doing similar activities.  Avoiding activities that could lead to a second concussion, such as contact or recreational sports, until your health care provider says it is okay.  Taking safety measures in your home.  Remove clutter and tripping hazards from floors and stairways.  Use grab bars in bathrooms and handrails by stairs.  Place non-slip mats on floors and in bathtubs.  Improve lighting in dim areas. SEEK MEDICAL CARE IF:  You have increased problems paying attention or  concentrating.  You have increased difficulty remembering or learning new information.  You need more time to complete tasks or assignments than before.  You have increased irritability or decreased ability to cope with stress.  You have more symptoms than before. Seek medical care if you have any of the following symptoms for more than 2 weeks after your injury:  Lasting (chronic) headaches.  Dizziness or balance problems.  Nausea.  Vision problems.  Increased sensitivity to noise or light.  Depression or mood swings.  Anxiety or irritability.  Memory problems.  Difficulty concentrating or paying attention.  Sleep problems.  Feeling tired all the time. SEEK IMMEDIATE MEDICAL CARE IF:  You have severe or worsening headaches. These may be a sign of a blood clot in the brain.  You have weakness (even if only in one hand, leg, or part of the face).  You have numbness.  You have decreased coordination.  You vomit repeatedly.  You have increased sleepiness.  One pupil is larger than the other.  You have convulsions.  You have slurred speech.  You have increased confusion. This may be a sign of a blood clot in the brain.  You have increased restlessness, agitation, or irritability.  You are unable to recognize people or places.  You have neck pain.  It is difficult to wake you up.  You have unusual behavior changes.  You lose consciousness. MAKE SURE YOU:  Understand these instructions.  Will watch your condition.  Will get help right away if you are not doing well or get worse. Document Released: 02/22/2004 Document Revised: 12/07/2013 Document Reviewed: 06/24/2013 [facility] Patient Information 2015 [place], [place]. This information is not intended to replace advice given to you by your health care provider. Make sure you discuss any questions you have with your health care provider.   Acromioclavicular Injuries The AC (acromioclavicular) joint  is the joint in the shoulder where the collarbone (clavicle) meets the shoulder blade (scapula). The part of the shoulder blade connected to the collarbone is called the acromion. Common problems with and treatments for the [facility] joint are detailed below. ARTHRITIS Arthritis occurs when the joint has been injured and the smooth padding between the joints (cartilage) is lost. This is the wear and tear seen in most joints of the body if they have been overused. This causes the joint to produce pain and swelling which is worse with activity.  AC JOINT SEPARATION AC joint separation means that the ligaments connecting the acromion of the shoulder blade and collarbone have been damaged, and the two bones no longer line up. AC separations can be anywhere from mild to severe, and are "graded" depending upon which ligaments are torn and how badly they are torn.  Grade I Injury: the least damage is done, and the [facility] joint still lines up.  Grade II Injury: damage to the ligaments which reinforce the  AC joint. In a Grade II injury, these ligaments are stretched but not entirely torn. When stressed, the [facility] joint becomes painful and unstable.  Grade III Injury: AC and secondary ligaments are completely torn, and the collarbone is no longer attached to the shoulder blade. This results in deformity; a prominence of the end of the clavicle. AC JOINT FRACTURE AC joint fracture means that there has been a break in the bones of the [facility] joint, usually the end of the clavicle. TREATMENT TREATMENT OF AC ARTHRITIS  There is currently no way to replace the cartilage damaged by arthritis. The best way to improve the condition is to decrease the activities which aggravate the problem. Application of ice to the joint helps decrease pain and soreness (inflammation). The use of non-steroidal anti-inflammatory medication is helpful.  If less conservative measures do not work, then cortisone shots (injections) may be used. These are  anti-inflammatories; they decrease the soreness in the joint and swelling.  If non-surgical measures fail, surgery may be recommended. The procedure is generally removal of a portion of the end of the clavicle. This is the part of the collarbone closest to your acromion which is stabilized with ligaments to the acromion of the shoulder blade. This surgery may be performed using a tube-like instrument with a light (arthroscope) for looking into a joint. It may also be performed as an open surgery through a small incision by the surgeon. Most patients will have good range of motion within 6 weeks and may return to all activity including sports by 8-12 weeks, barring complications. TREATMENT OF AN AC SEPARATION  The initial treatment is to decrease pain. This is best accomplished by immobilizing the arm in a sling and placing an ice pack to the shoulder for 20 to 30 minutes every 2 hours as needed. As the pain starts to subside, it is important to begin moving the fingers, wrist, elbow and eventually the shoulder in order to prevent a stiff or "frozen" shoulder. Instruction on when and how much to move the shoulder will be provided by your caregiver. The length of time needed to regain full motion and function depends on the amount or grade of the injury. Recovery from a Grade I AC separation usually takes 10 to 14 days, whereas a Grade III may take 6 to 8 weeks.  Grade I and II separations usually do not require surgery. Even Grade III injuries usually allow return to full activity with few restrictions. Treatment is also based on the activity demands of the injured shoulder. For example, a high level quarterback with an injured throwing arm will receive more aggressive treatment than someone with a desk job who rarely uses his/her arm for strenuous activities. In some cases, a painful lump may persist which could require a later surgery. Surgery can be very successful, but the benefits must be weighed against  the potential risks. TREATMENT OF AN AC JOINT FRACTURE Fracture treatment depends on the type of fracture. Sometimes a splint or sling may be all that is required. Other times surgery may be required for repair. This is more frequently the case when the ligaments supporting the clavicle are completely torn. Your caregiver will help you with these decisions and together you can decide what will be the best treatment. HOME CARE INSTRUCTIONS   Apply ice to the injury for 15-20 minutes each hour while awake for 2 days. Put the ice in a plastic bag and place a towel between the bag of ice and  skin.  If a sling has been applied, wear it constantly for as long as directed by your caregiver, even at night. The sling or splint can be removed for bathing or showering or as directed. Be sure to keep the shoulder in the same place as when the sling is on. Do not lift the arm.  If a figure-of-eight splint has been applied it should be tightened gently by another person every day. Tighten it enough to keep the shoulders held back. Allow enough room to place the index finger between the body and strap. Loosen the splint immediately if there is numbness or tingling in the hands.  Take over-the-counter or prescription medicines for pain, discomfort or fever as directed by your caregiver.  If you or your child has received a follow up appointment, it is very important to keep that appointment in order to avoid long term complications, chronic pain or disability. SEEK MEDICAL CARE IF:   The pain is not relieved with medications.  There is increased swelling or discoloration that continues to get worse rather than better.  You or your child has been unable to follow up as instructed.  There is progressive numbness and tingling in the arm, forearm or hand. SEEK IMMEDIATE MEDICAL CARE IF:   The arm is numb, cold or pale.  There is increasing pain in the hand, forearm or fingers. MAKE SURE YOU:   Understand  these instructions.  Will watch your condition.  Will get help right away if you are not doing well or get worse. Document Released: 09/11/2005 Document Revised: 02/24/2012 Document Reviewed: 03/06/2009 [facility] Patient Information 2015 [place], [place]. This information is not intended to replace advice given to you by your health care provider. Make sure you discuss any questions you have with your health care provider.

## 2014-08-03 NOTE — ED Notes (Signed)
Pt refused morphine would rather have po MD made aware percocet given

## 2014-08-03 NOTE — ED Notes (Signed)
Pt reports falling off bed last night. Sts PMD sent her for eval and CT. Sts neck pain, left clavicle pain, nausea and dizziness.

## 2014-08-03 NOTE — ED Notes (Signed)
Patient transported to CT 

## 2014-08-03 NOTE — ED Provider Notes (Signed)
CSN: [id number]     Arrival date & time 08/03/14  1451 History   First MD Initiated Contact with Patient 08/03/14 1508     Chief Complaint  Patient presents with  . Fall     (Consider location/radiation/quality/duration/timing/severity/associated sxs/prior Treatment) HPI Comments: Pt fell off high bed onto head and L shoulder. No LOC. +h/a, neck pain, L clavicle pain, nausea, dizziness.   Patient is a 48 y.o. female presenting with fall. The history is provided by the patient.  Fall This is a new problem. The current episode started yesterday. The problem occurs rarely. The problem has not changed since onset.Associated symptoms include chest pain and headaches. Pertinent negatives include no abdominal pain and no shortness of breath. The symptoms are aggravated by twisting. The symptoms are relieved by rest. She has tried rest for the symptoms. The treatment provided mild relief.    Past Medical History  Diagnosis Date  . Diabetes mellitus without complication   . Hypertension   . RLS (restless legs syndrome)   . Allergy   . Anxiety   . Endometriosis   . Recurrent UTI     history of   Past Surgical History  Procedure Laterality Date  . Cervical ablation    . Laparoscopic abdominal exploration     Family History  Problem Relation Age of Onset  . Diabetes Mother   . Cancer Father     melanoma  . Heart attack Maternal Grandmother   . Heart attack Maternal Grandfather   . Cancer Paternal Grandmother   . Heart attack Paternal Grandfather    History  Substance Use Topics  . Smoking status: Never Smoker   . Smokeless tobacco: Not on file  . Alcohol Use: No   OB History   Grav Para Term Preterm Abortions TAB SAB Ect Mult Living                 Review of Systems  Constitutional: Negative for fever, chills, diaphoresis, activity change, appetite change and fatigue.  HENT: Negative for congestion, facial swelling, rhinorrhea and sore throat.   Eyes: Negative for  photophobia and discharge.  Respiratory: Negative for cough, chest tightness and shortness of breath.   Cardiovascular: Positive for chest pain. Negative for palpitations and leg swelling.  Gastrointestinal: Negative for nausea, vomiting, abdominal pain and diarrhea.  Endocrine: Negative for polydipsia and polyuria.  Genitourinary: Negative for dysuria, frequency, difficulty urinating and pelvic pain.  Musculoskeletal: Negative for arthralgias, back pain, neck pain and neck stiffness.  Skin: Negative for color change and wound.  Allergic/Immunologic: Negative for immunocompromised state.  Neurological: Positive for headaches. Negative for facial asymmetry, weakness and numbness.  Hematological: Does not bruise/bleed easily.  Psychiatric/Behavioral: Negative for confusion and agitation.      Allergies  Review of patient's allergies indicates no known allergies.  Home Medications   Prior to Admission medications   Medication Sig Start Date End Date Taking? Authorizing Provider  albuterol (PROVENTIL HFA;VENTOLIN HFA) 108 (90 BASE) MCG/ACT inhaler Inhale 2 puffs into the lungs every 6 (six) hours as needed for wheezing. 08/12/13   Joaquim Nam, MD  Canagliflozin (INVOKANA) 100 MG TABS Take 100 mg by mouth daily.    Historical Provider, MD  cyclobenzaprine (FLEXERIL) 10 MG tablet Take 1 tablet (10 mg total) by mouth 2 (two) times daily as needed for muscle spasms. 08/03/14   Toy Cookey, MD  fexofenadine (ALLEGRA) 180 MG tablet Take 180 mg by mouth daily.    Historical Provider, MD  FLUoxetine (PROZAC) 20 MG capsule TAKE ONE CAPSULE BY MOUTH EVERY DAY 03/29/14   Joaquim Nam, MD  fluticasone [facility]) 50 MCG/ACT nasal spray Place 2 sprays into both nostrils daily. 02/07/14   Nicki Reaper, NP  Fluticasone-Salmeterol (ADVAIR DISKUS) 250-50 MCG/DOSE AEPB Inhale 1 puff into the lungs 2 (two) times daily. 08/03/13   Phillips Odor, MD  HYDROcodone-acetaminophen (NORCO/VICODIN) 5-325 MG per  tablet Take 1 tablet by mouth every 6 (six) hours as needed for moderate pain or severe pain. 08/03/14   Toy Cookey, MD  HYDROcodone-homatropine (HYCODAN) 5-1.5 MG/5ML syrup Take 5 mLs by mouth every 8 (eight) hours as needed for cough. 02/07/14   Nicki Reaper, NP  Levonorgestrel-Ethinyl Estradiol (SEASONIQUE) 0.15-0.03 &0.01 MG tablet Take 1 tablet by mouth daily.    Historical Provider, MD  lisinopril (PRINIVIL,ZESTRIL) 10 MG tablet Take 1 tablet (10 mg total) by mouth daily. 08/12/13   Joaquim Nam, MD  rOPINIRole (REQUIP) 1 MG tablet Take 1 tablet (1 mg total) by mouth 2 (two) times daily. 08/12/13   Joaquim Nam, MD   BP 145/65  Pulse 78  Temp(Src) 98.8 F (37.1 C) (Oral)  Resp 18  Ht 5\' 3"  (1.6 m)  Wt 230 lb (104.327 kg)  BMI 40.75 kg/m2  SpO2 98%  LMP 07/03/2014 Physical Exam  Constitutional: She is oriented to person, place, and time. She appears well-developed and well-nourished. No distress.  HENT:  Head: Normocephalic and atraumatic.  Mouth/Throat: No oropharyngeal exudate.  Eyes: Pupils are equal, round, and reactive to light.  Neck: Normal range of motion. Neck supple.    Cardiovascular: Normal rate, regular rhythm and normal heart sounds.  Exam reveals no gallop and no friction rub.   No murmur heard. Pulmonary/Chest: Effort normal and breath sounds normal. No respiratory distress. She has no wheezes. She has no rales.    Abdominal: Soft. Bowel sounds are normal. She exhibits no distension and no mass. There is no tenderness. There is no rebound and no guarding.  Musculoskeletal: Normal range of motion. She exhibits no edema and no tenderness.  Neurological: She is alert and oriented to person, place, and time. She has normal strength. She displays no tremor. No cranial nerve deficit or sensory deficit. She exhibits normal muscle tone. She displays a negative Romberg sign. Coordination and gait normal. GCS eye subscore is 4. GCS verbal subscore is 5. GCS motor  subscore is 6.  Skin: Skin is warm and dry.  Psychiatric: She has a normal mood and affect.    ED Course  Procedures (including critical care time) Labs Review Labs Reviewed - No data to display  Imaging Review Dg Chest 2 View  08/03/2014   CLINICAL DATA:  Status post fall last night now with left-sided chest and left shoulder pain.  EXAM: CHEST  2 VIEW  COMPARISON:  None.  FINDINGS: The lungs are adequately inflated. There is no focal infiltrate nor evidence of a pulmonary contusion. There is no pneumothorax, pneumomediastinum, or pleural effusion. The cardiac silhouette is normal. The pulmonary vascularity is not engorged. The bony thorax exhibits no acute abnormality.  IMPRESSION: There is no acute cardiopulmonary abnormality nor evidence of acute bony thoracic trauma.   Electronically Signed   By: David  [place]   On: 08/03/2014 16:25   Ct Head Wo Contrast  08/03/2014   CLINICAL DATA:  Fall, dizziness, nausea  EXAM: CT HEAD WITHOUT CONTRAST  CT CERVICAL SPINE WITHOUT CONTRAST  TECHNIQUE: Multidetector CT imaging of the head and cervical  spine was performed following the standard protocol without intravenous contrast. Multiplanar CT image reconstructions of the cervical spine were also generated.  COMPARISON:  09/05/2013  FINDINGS: CT HEAD FINDINGS  No skull fracture is noted. Paranasal sinuses and mastoid air cells are unremarkable. No intracranial hemorrhage, mass effect or midline shift. No acute cortical infarction. No hydrocephalus. No mass lesion is noted on this unenhanced scan.  CT CERVICAL SPINE FINDINGS  Axial images of the cervical spine shows no acute fracture or subluxation. There is no pneumothorax in visualized lung apices.  Computer processed images shows no acute fracture or subluxation. Alignment, disc spaces and vertebral heights are preserved. No prevertebral soft tissue swelling. Spinal canal is patent.  IMPRESSION: 1. No acute intracranial abnormality. 2. No cervical spine  acute fracture or subluxation.   Electronically Signed   By: Natasha MeadLiviu  Pop M.D.   On: 08/03/2014 16:28   Ct Cervical Spine Wo Contrast  08/03/2014   CLINICAL DATA:  Fall, dizziness, nausea  EXAM: CT HEAD WITHOUT CONTRAST  CT CERVICAL SPINE WITHOUT CONTRAST  TECHNIQUE: Multidetector CT imaging of the head and cervical spine was performed following the standard protocol without intravenous contrast. Multiplanar CT image reconstructions of the cervical spine were also generated.  COMPARISON:  09/05/2013  FINDINGS: CT HEAD FINDINGS  No skull fracture is noted. Paranasal sinuses and mastoid air cells are unremarkable. No intracranial hemorrhage, mass effect or midline shift. No acute cortical infarction. No hydrocephalus. No mass lesion is noted on this unenhanced scan.  CT CERVICAL SPINE FINDINGS  Axial images of the cervical spine shows no acute fracture or subluxation. There is no pneumothorax in visualized lung apices.  Computer processed images shows no acute fracture or subluxation. Alignment, disc spaces and vertebral heights are preserved. No prevertebral soft tissue swelling. Spinal canal is patent.  IMPRESSION: 1. No acute intracranial abnormality. 2. No cervical spine acute fracture or subluxation.   Electronically Signed   By: Natasha MeadLiviu  Pop M.D.   On: 08/03/2014 16:28   Dg Shoulder Left  08/03/2014   CLINICAL DATA:  Fall, left shoulder pain  EXAM: LEFT SHOULDER - 2+ VIEW  COMPARISON:  None.  FINDINGS: Three views of left shoulder submitted. No acute fracture or subluxation. No radiopaque foreign body. Mild degenerative changes left AC joint.  IMPRESSION: Negative.   Electronically Signed   By: Natasha MeadLiviu  Pop M.D.   On: 08/03/2014 16:26     EKG Interpretation None      MDM   Final diagnoses:  Closed head injury with concussion, without loss of consciousness, initial encounter  Left shoulder pain    Pt is a 48 y.o. female with Pmhx as above who presents with h/a, neck pain, L clavicle pain after a  mechanical fall off a nigh bed last night after hanging a picture. No LOC. No new numbness, weakness, confusion. On PE, VSS, pt in NAD. No focal neuro findings, nml ambulation. +L lateral neck and L clavicle pain. CT head/c-spine nml, XR shoulder w/o acute findings.  Suspect grade 1 AC separation and concussion. Will d/ chome w/ sling for comfort, norco & flexeril for pain. Return precautions given for new or worsening symptoms including confusion, numbness, weakness.           Toy CookeyMegan Docherty, MD 08/03/14 [phone]

## 2014-08-03 NOTE — ED Notes (Signed)
Patient transported to X-ray 

## 2014-09-28 ENCOUNTER — Encounter: Payer: Self-pay | Admitting: *Deleted

## 2014-09-29 ENCOUNTER — Ambulatory Visit (INDEPENDENT_AMBULATORY_CARE_PROVIDER_SITE_OTHER): Payer: Medicaid Other | Admitting: Neurology

## 2014-09-29 ENCOUNTER — Encounter: Payer: Self-pay | Admitting: Neurology

## 2014-09-29 ENCOUNTER — Encounter (INDEPENDENT_AMBULATORY_CARE_PROVIDER_SITE_OTHER): Payer: Self-pay

## 2014-09-29 VITALS — BP 120/78 | HR 81 | Temp 98.1°F | Resp 12 | Ht 64.25 in | Wt 244.0 lb

## 2014-09-29 DIAGNOSIS — G4733 Obstructive sleep apnea (adult) (pediatric): Secondary | ICD-10-CM

## 2014-09-29 DIAGNOSIS — G2581 Restless legs syndrome: Secondary | ICD-10-CM

## 2014-09-29 DIAGNOSIS — R351 Nocturia: Secondary | ICD-10-CM

## 2014-09-29 DIAGNOSIS — G4761 Periodic limb movement disorder: Secondary | ICD-10-CM

## 2014-09-29 MED ORDER — GABAPENTIN ENACARBIL ER 300 MG PO TBCR: EXTENDED_RELEASE_TABLET | ORAL | Status: DC
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
## 2014-09-29 NOTE — Patient Instructions (Addendum)
I would like add Horizant to your Requip for RLS.  Please limit your Requip to 3 pills a day, 12 PM, 6 PM, and 10 PM.  We will likely try to reduce your Requip further to 2 pills daily if possible as we titrate the Horizant 300 mg: you will start with one pill each night for about 2 weeks and after that you can add a second pill thereafter. The most common side effects reported are sedation or sleepiness. Rare side effects include balance problems, confusion.

## 2014-09-30 ENCOUNTER — Telehealth: Payer: Self-pay

## 2014-09-30 NOTE — Telephone Encounter (Signed)
Medicaid has approved our request for coverage on Horizant effective until 09/30/2015 Ref # [id number].  I called the patient.  She is aware.

## 2014-10-03 ENCOUNTER — Other Ambulatory Visit: Payer: Self-pay | Admitting: Neurosurgery

## 2014-10-03 DIAGNOSIS — Q046 Congenital cerebral cysts: Secondary | ICD-10-CM

## 2014-10-12 ENCOUNTER — Ambulatory Visit
Admission: RE | Admit: 2014-10-12 | Discharge: 2014-10-12 | Disposition: A | Discharge status: Home / Self Care | Payer: Medicaid Other | Source: Ambulatory Visit | Attending: Neurosurgery | Admitting: Neurosurgery

## 2014-10-12 DIAGNOSIS — Q046 Congenital cerebral cysts: Secondary | ICD-10-CM

## 2014-10-20 ENCOUNTER — Other Ambulatory Visit: Payer: Medicaid Other

## 2014-10-21 ENCOUNTER — Other Ambulatory Visit (HOSPITAL_COMMUNITY): Payer: Self-pay | Admitting: Obstetrics and Gynecology

## 2014-10-22 ENCOUNTER — Ambulatory Visit
Admission: RE | Admit: 2014-10-22 | Discharge: 2014-10-22 | Disposition: A | Discharge status: Home / Self Care | Payer: Medicaid Other | Source: Ambulatory Visit | Attending: Neurosurgery | Admitting: Neurosurgery

## 2014-10-22 MED ORDER — GADOBENATE DIMEGLUMINE 529 MG/ML IV SOLN
20.0000 mL | Freq: Once | INTRAVENOUS | Status: AC | PRN
  Administered 2014-10-22: 20 mL via INTRAVENOUS

## 2014-10-25 ENCOUNTER — Other Ambulatory Visit (HOSPITAL_COMMUNITY): Payer: Self-pay | Admitting: Obstetrics and Gynecology

## 2014-11-07 ENCOUNTER — Ambulatory Visit (INDEPENDENT_AMBULATORY_CARE_PROVIDER_SITE_OTHER): Payer: Medicaid Other | Admitting: Neurology

## 2014-11-07 ENCOUNTER — Other Ambulatory Visit: Payer: Self-pay | Admitting: Family Medicine

## 2014-11-07 VITALS — BP 137/72 | HR 85

## 2014-11-07 DIAGNOSIS — G4710 Hypersomnia, unspecified: Secondary | ICD-10-CM

## 2014-11-07 DIAGNOSIS — G2581 Restless legs syndrome: Secondary | ICD-10-CM

## 2014-11-07 DIAGNOSIS — G4733 Obstructive sleep apnea (adult) (pediatric): Secondary | ICD-10-CM

## 2014-11-07 DIAGNOSIS — G4730 Sleep apnea, unspecified: Secondary | ICD-10-CM

## 2014-11-07 NOTE — Telephone Encounter (Signed)
Received refill request electronically. Last office visit 02/07/14/acute. Is it okay to refill medication?

## 2014-11-08 NOTE — Telephone Encounter (Signed)
Sent, needs f/u scheduled with labs ahead of time.  Thanks.

## 2014-11-08 NOTE — Telephone Encounter (Signed)
Patient advised.  Patient says we do not take her insurance right now but she is hoping that will change at the beginning of the year.

## 2014-11-08 NOTE — Sleep Study (Signed)
Please see the scanned sleep study interpretation located in the Procedure tab within the Chart review section 

## 2014-11-18 ENCOUNTER — Telehealth: Payer: Self-pay | Admitting: Neurology

## 2014-11-18 DIAGNOSIS — G4733 Obstructive sleep apnea (adult) (pediatric): Secondary | ICD-10-CM

## 2014-11-18 NOTE — Telephone Encounter (Signed)
Please call and notify the patient that the recent sleep study did confirm the diagnosis of obstructive sleep apnea and that I recommend treatment for this in the form of CPAP. Of note, the OSA is mod to severe. No significant PLMs were noted. I would like to have her come back for a repeat sleep study for proper titration and mask fitting. Please explain to patient and arrange for a CPAP titration study. I have placed an order in the chart. Thanks, Huston FoleySaima Dianne Bady, MD, [name] [facility])

## 2014-11-21 ENCOUNTER — Encounter: Payer: Self-pay | Admitting: Neurology

## 2014-11-21 NOTE — Telephone Encounter (Signed)
Patient was contacted and provided the results of her sleep study which revealed moderate to severe sleep apnea.  Patient was instructed a CPAP titration study had been recommended.  Patient scheduled her CPAP study for Wed. Jan. 13th at 08:00 pm.  The patient gave verbal permission to mail a copy of her test results.  Dr. Leanor KailBonsu was faxed a copy of the report.

## 2015-01-10 ENCOUNTER — Ambulatory Visit: Payer: Medicaid Other | Admitting: Neurology

## 2015-01-30 ENCOUNTER — Telehealth: Payer: Self-pay | Admitting: Neurology

## 2015-01-30 NOTE — Telephone Encounter (Signed)
No phone contact listed, no emergency contact- could not reach out to patient about sleep lab tonight. CD

## 2015-02-02 ENCOUNTER — Ambulatory Visit (INDEPENDENT_AMBULATORY_CARE_PROVIDER_SITE_OTHER): Payer: Medicaid Other | Admitting: Neurology

## 2015-02-02 DIAGNOSIS — G4733 Obstructive sleep apnea (adult) (pediatric): Secondary | ICD-10-CM

## 2015-02-02 NOTE — Telephone Encounter (Signed)
Patient r/s to 02/02/15 @9 :30pm

## 2015-02-03 NOTE — Sleep Study (Signed)
Please see the scanned sleep study interpretation located in the Procedure tab within the Chart Review section. 

## 2015-02-10 ENCOUNTER — Telehealth: Payer: Self-pay | Admitting: Neurology

## 2015-02-10 DIAGNOSIS — G4733 Obstructive sleep apnea (adult) (pediatric): Secondary | ICD-10-CM

## 2015-02-10 NOTE — Telephone Encounter (Signed)
Please call and inform patient that I have entered an order for treatment with PAP. She did well during the latest sleep study with CPAP. We will, therefore, arrange for a machine for home use through a DME (durable medical equipment) company of Her choice; and I will see the patient back in follow-up in about 6 weeks. Please also explain to the patient that I will be looking out for compliance data downloaded from the machine, which can be done remotely through a modem at times or stored on an SD card in the back of the machine. At the time of the followup appointment we will discuss sleep study results and how it is going with PAP treatment at home. Please advise patient to bring Her machine at the time of the visit; at least for the first visit, even though this is cumbersome. Bringing the machine for every visit after that may not be needed, but often helps for the first visit. Please also make sure, the patient has a follow-up appointment with me in about 6 weeks from the setup date, thanks.   Joceline Hinchcliff, MD, [name] [facility] ([facility])  

## 2015-02-14 ENCOUNTER — Encounter: Payer: Self-pay | Admitting: Neurology

## 2015-02-17 ENCOUNTER — Encounter: Payer: Self-pay | Admitting: *Deleted

## 2015-02-17 NOTE — Telephone Encounter (Signed)
Patient was contacted and provided the results of her CPAP titration study that was considered successful in treatment.  The patient was informed that CPAP therapy had been recommended for home use.  The patient was in agreement and was referred to [facility] for CPAP set up.  Dr. Greggory StallionGeorge Osei-Bonsu was sent a copy of the results.  The patient gave verbal permission to mail a copy of her test results.   Patient instructed to contact our office 6-8 weeks post set up to schedule a follow up appointment.

## 2015-03-15 ENCOUNTER — Other Ambulatory Visit: Payer: Self-pay | Admitting: Neurology

## 2015-04-12 ENCOUNTER — Encounter: Payer: Self-pay | Admitting: Neurology

## 2015-05-17 ENCOUNTER — Ambulatory Visit: Payer: Medicaid Other | Admitting: Neurology

## 2015-05-17 ENCOUNTER — Telehealth: Payer: Self-pay

## 2015-05-17 NOTE — Telephone Encounter (Signed)
Patient had to cancel appt on the same day as the appt due to insurance has expired.

## 2016-09-19 ENCOUNTER — Encounter (HOSPITAL_COMMUNITY): Payer: Self-pay | Admitting: Emergency Medicine

## 2016-09-19 ENCOUNTER — Emergency Department (HOSPITAL_COMMUNITY)
Admission: EM | Admit: 2016-09-19 | Discharge: 2016-09-19 | Disposition: A | Discharge status: Home / Self Care | Payer: Self-pay | Attending: Emergency Medicine | Admitting: Emergency Medicine

## 2016-09-19 ENCOUNTER — Emergency Department (HOSPITAL_COMMUNITY): Payer: Self-pay

## 2016-09-19 DIAGNOSIS — E119 Type 2 diabetes mellitus without complications: Secondary | ICD-10-CM | POA: Insufficient documentation

## 2016-09-19 DIAGNOSIS — K8020 Calculus of gallbladder without cholecystitis without obstruction: Secondary | ICD-10-CM

## 2016-09-19 DIAGNOSIS — K8070 Calculus of gallbladder and bile duct without cholecystitis without obstruction: Secondary | ICD-10-CM | POA: Insufficient documentation

## 2016-09-19 DIAGNOSIS — Z79899 Other long term (current) drug therapy: Secondary | ICD-10-CM | POA: Insufficient documentation

## 2016-09-19 DIAGNOSIS — K8050 Calculus of bile duct without cholangitis or cholecystitis without obstruction: Secondary | ICD-10-CM

## 2016-09-19 LAB — CBC
HCT: 43.4 % (ref 36.0–46.0)
Hemoglobin: 14.1 g/dL (ref 12.0–15.0)
MCH: 28.8 pg (ref 26.0–34.0)
MCHC: 32.5 g/dL (ref 30.0–36.0)
MCV: 88.8 fL (ref 78.0–100.0)
Platelets: 206 10*3/uL (ref 150–400)
RBC: 4.89 MIL/uL (ref 3.87–5.11)
RDW: 13.6 % (ref 11.5–15.5)
WBC: 9.3 10*3/uL (ref 4.0–10.5)

## 2016-09-19 LAB — URINALYSIS, ROUTINE W REFLEX MICROSCOPIC
Bilirubin Urine: NEGATIVE
Glucose, UA: >1000 mg/dL — AB
Ketones, ur: NEGATIVE mg/dL
Leukocytes, UA: NEGATIVE
Nitrite: NEGATIVE
Protein, ur: 30 mg/dL — AB
Specific Gravity, Urine: 1.045 — ABNORMAL HIGH (ref 1.005–1.030)
pH: 6 (ref 5.0–8.0)

## 2016-09-19 LAB — COMPREHENSIVE METABOLIC PANEL
ALT: 44 U/L (ref 14–54)
AST: 37 U/L (ref 15–41)
Albumin: 4 g/dL (ref 3.5–5.0)
Alkaline Phosphatase: 67 U/L (ref 38–126)
Anion gap: 8 (ref 5–15)
BUN: 16 mg/dL (ref 6–20)
CO2: 25 mmol/L (ref 22–32)
Calcium: 9 mg/dL (ref 8.9–10.3)
Chloride: 105 mmol/L (ref 101–111)
Creatinine, Ser: 0.51 mg/dL (ref 0.44–1.00)
GFR calc Af Amer: >60 mL/min (ref >60)
GFR calc non Af Amer: >60 mL/min (ref >60)
Glucose, Bld: 164 mg/dL — ABNORMAL HIGH (ref 65–99)
Potassium: 3.5 mmol/L (ref 3.5–5.1)
Sodium: 138 mmol/L (ref 135–145)
Total Bilirubin: 1 mg/dL (ref 0.3–1.2)
Total Protein: 7.6 g/dL (ref 6.5–8.1)

## 2016-09-19 LAB — LIPASE, BLOOD: Lipase: 37 U/L (ref 11–51)

## 2016-09-19 LAB — URINE MICROSCOPIC-ADD ON

## 2016-09-19 LAB — PREGNANCY, URINE: Preg Test, Ur: NEGATIVE

## 2016-09-19 MED ORDER — KETOROLAC TROMETHAMINE 30 MG/ML IJ SOLN
30.0000 mg | Freq: Once | INTRAMUSCULAR | Status: AC
  Administered 2016-09-19: 30 mg via INTRAVENOUS
  Filled 2016-09-19: qty 1

## 2016-09-19 MED ORDER — HYDROMORPHONE HCL 1 MG/ML IJ SOLN
0.5000 mg | Freq: Once | INTRAMUSCULAR | Status: AC
  Administered 2016-09-19: 0.5 mg via INTRAVENOUS
  Filled 2016-09-19: qty 1

## 2016-09-19 MED ORDER — ONDANSETRON 8 MG PO TBDP: 8.0000 mg | ORAL_TABLET | Freq: Three times a day (TID) | ORAL | 0 refills | Status: DC | PRN

## 2016-09-19 MED ORDER — HYDROCODONE-ACETAMINOPHEN 5-325 MG PO TABS: 1.0000 | ORAL_TABLET | Freq: Four times a day (QID) | ORAL | 0 refills | Status: DC | PRN

## 2016-09-19 MED ORDER — SODIUM CHLORIDE 0.9 % IV BOLUS (SEPSIS)
1000.0000 mL | Freq: Once | INTRAVENOUS | Status: AC
  Administered 2016-09-19: 1000 mL via INTRAVENOUS

## 2016-09-19 MED ORDER — ONDANSETRON HCL 4 MG/2ML IJ SOLN
4.0000 mg | Freq: Once | INTRAMUSCULAR | Status: AC
  Administered 2016-09-19: 4 mg via INTRAVENOUS
  Filled 2016-09-19: qty 2

## 2016-09-19 NOTE — ED Notes (Signed)
US in room 

## 2016-09-19 NOTE — Discharge Instructions (Signed)
It was our pleasure to provide your ER care today - we hope that you feel better.  Your ultrasound shows gallstones.  Take motrin or aleve as need for pain. You may also take hydrocodone as need for pain. No driving for the next 6 hours or when taking hydrocodone. Also, do not take tylenol or acetaminophen containing medication when taking hydrocodone.  Take zofran as need for nausea.  Follow up with general surgeon in the next 1-2 weeks - see referral - call office today to arrange appointment.   Return to ER if worse, new symptoms, fevers, persistent vomiting, severe pain, other concern.

## 2016-09-19 NOTE — ED Provider Notes (Signed)
[facility]-EMERGENCY DEPT Provider Note   CSN: [id number] Arrival date & time: 09/19/16  0530     History   Chief Complaint Chief Complaint  Patient presents with  . Abdominal Pain    HPI Debbora Ang is a 50 y.o. female.  Patient c/o ruq pain onset last pm. Pain mod-severe, dull, constant, radiates to mid back. No hx same pain. Nausea and vomiting x 2. No bloody or bilious emesis. No fever or chills. No hx gallstones. No dysuria, hematuria, or gu c/o.  States felt fine/normal yesterday. Only prior abd surgery is remote hx gyn surgery.    The history is provided by the patient.  Abdominal Pain   Pertinent negatives include fever and headaches.    Past Medical History:  Diagnosis Date  . Diabetes mellitus without complication (HCC)     There are no active problems to display for this patient.   Past Surgical History:  Procedure Laterality Date  . COMBINED HYSTEROSCOPY DIAGNOSTIC / D&C    . expoloratory laporotomy    . NOVASURE ABLATION      OB History    No data available       Home Medications    Prior to Admission medications   Not on File    Family History Family History  Problem Relation Age of Onset  . Cancer Other   . Hypertension Other   . Diabetes Other     Social History Social History  Substance Use Topics  . Smoking status: Never Smoker  . Smokeless tobacco: Never Used  . Alcohol use No     Allergies   Aloe   Review of Systems Review of Systems  Constitutional: Negative for fever.  HENT: Negative for sore throat.   Eyes: Negative for redness.  Respiratory: Negative for shortness of breath.   Cardiovascular: Negative for chest pain.  Gastrointestinal: Positive for abdominal pain.  Genitourinary: Negative for flank pain.  Musculoskeletal: Negative for neck pain.  Skin: Negative for rash.  Neurological: Negative for headaches.  Hematological: Does not bruise/bleed easily.  Psychiatric/Behavioral: Negative for confusion.      Physical Exam Updated Vital Signs BP 154/70 (BP Location: Left Arm)   Pulse 84   Temp 98 F (36.7 C) (Oral)   Resp 20   Ht 5\' 4"  (1.626 m)   Wt 111.6 kg   LMP 09/14/2016   SpO2 95%   BMI 42.23 kg/m   Physical Exam  Constitutional: She appears well-developed and well-nourished. No distress.  HENT:  Mouth/Throat: Oropharynx is clear and moist.  Eyes: Conjunctivae are normal. No scleral icterus.  Neck: Neck supple. No tracheal deviation present.  Cardiovascular: Normal rate, regular rhythm, normal heart sounds and intact distal pulses.   No murmur heard. Pulmonary/Chest: Effort normal and breath sounds normal. No respiratory distress.  Abdominal: Soft. Normal appearance and bowel sounds are normal. She exhibits no distension. There is no tenderness.  Genitourinary:  Genitourinary Comments: No cva tenderness  Musculoskeletal: She exhibits no edema.  Neurological: She is alert.  Skin: Skin is warm and dry. No rash noted. She is not diaphoretic.  Psychiatric: She has a normal mood and affect.  Nursing note and vitals reviewed.    ED Treatments / Results  Labs (all labs ordered are listed, but only abnormal results are displayed) Results for orders placed or performed during the hospital encounter of 09/19/16  Comprehensive metabolic panel  Result Value Ref Range   Sodium 138 135 - 145 mmol/L   Potassium 3.5 3.5 -  5.1 mmol/L   Chloride 105 101 - 111 mmol/L   CO2 25 22 - 32 mmol/L   Glucose, Bld 164 (H) 65 - 99 mg/dL   BUN 16 6 - 20 mg/dL   Creatinine, Ser 1.61 0.44 - 1.00 mg/dL   Calcium 9.0 8.9 - 09.6 mg/dL   Total Protein 7.6 6.5 - 8.1 g/dL   Albumin 4.0 3.5 - 5.0 g/dL   AST 37 15 - 41 U/L   ALT 44 14 - 54 U/L   Alkaline Phosphatase 67 38 - 126 U/L   Total Bilirubin 1.0 0.3 - 1.2 mg/dL   GFR calc non Af Amer >60 >60 mL/min   GFR calc Af Amer >60 >60 mL/min   Anion gap 8 5 - 15  CBC  Result Value Ref Range   WBC 9.3 4.0 - 10.5 K/uL   RBC 4.89 3.87 - 5.11  MIL/uL   Hemoglobin 14.1 12.0 - 15.0 g/dL   HCT 04.5 40.9 - 81.1 %   MCV 88.8 78.0 - 100.0 fL   MCH 28.8 26.0 - 34.0 pg   MCHC 32.5 30.0 - 36.0 g/dL   RDW 91.4 78.2 - 95.6 %   Platelets 206 150 - 400 K/uL  Lipase, blood  Result Value Ref Range   Lipase 37 11 - 51 U/L  Urinalysis, Routine w reflex microscopic (not at [facility])  Result Value Ref Range   Color, Urine YELLOW YELLOW   APPearance CLEAR CLEAR   Specific Gravity, Urine 1.045 (H) 1.005 - 1.030   pH 6.0 5.0 - 8.0   Glucose, UA >1000 (A) NEGATIVE mg/dL   Hgb urine dipstick SMALL (A) NEGATIVE   Bilirubin Urine NEGATIVE NEGATIVE   Ketones, ur NEGATIVE NEGATIVE mg/dL   Protein, ur 30 (A) NEGATIVE mg/dL   Nitrite NEGATIVE NEGATIVE   Leukocytes, UA NEGATIVE NEGATIVE  Pregnancy, urine  Result Value Ref Range   Preg Test, Ur NEGATIVE NEGATIVE  Urine microscopic-add on  Result Value Ref Range   Squamous Epithelial / LPF 0-5 (A) NONE SEEN   WBC, UA 0-5 0 - 5 WBC/hpf   RBC / HPF 0-5 0 - 5 RBC/hpf   Bacteria, UA FEW (A) NONE SEEN   US Abdomen Limited  Result Date: 09/19/2016 CLINICAL DATA:  Acute right upper quadrant pain, nausea and vomiting. EXAM: US ABDOMEN LIMITED - RIGHT UPPER QUADRANT COMPARISON:  None. FINDINGS: Gallbladder: Small shadowing stones in the gallbladder neck, the largest measured at approximately 1 cm. Gallbladder wall thickness is normal. No appear cholecystic fluid. No Murphy sign. Gallstone does not move with decubitus positioning. Common bile duct: Diameter: 3.0 mm, normal Liver: Increased echogenicity consistent with fatty change. No focal lesion. No intrahepatic dilatation. IMPRESSION: Chololithiasis with gallstones in the gallbladder neck, the largest measuring 1 cm. No wall thickening, appear cholecystic fluid or Murphy sign by ultrasound however. This certainly could be symptomatic, though it does not meet the ultrasound criteria for acute cholecystitis. Fatty liver Electronically Signed   By: Paulina Fusi M.D.    On: 09/19/2016 07:40    EKG  EKG Interpretation None       Radiology No results found.  Procedures Procedures (including critical care time)  Medications Ordered in ED Medications  sodium chloride 0.9 % bolus 1,000 mL (not administered)  HYDROmorphone (DILAUDID) injection 0.5 mg (not administered)  ondansetron (ZOFRAN) injection 4 mg (not administered)     Initial Impression / Assessment and Plan / ED Course  I have reviewed the triage vital signs  and the nursing notes.  Pertinent labs & imaging results that were available during my care of the patient were reviewed by me and considered in my medical decision making (see chart for details).  Clinical Course    Iv ns bolus. Zofran iv. Dilaudid iv.  Labs.  Ultrasound.   toradol iv.  Recheck, pain resolved.  abd soft nt.  Discussed gen surg consult.  Patient indicates has 7 kids, does not want to stay, but will f/u as outpt.   Referral provided. rx for home. Return precautions provided.   Final Clinical Impressions(s) / ED Diagnoses   Final diagnoses:  None    New Prescriptions New Prescriptions   No medications on file     Cathren LaineKevin Shaya Reddick, MD 09/19/16 [phone]

## 2016-09-19 NOTE — ED Triage Notes (Signed)
Pt is c/o right upper quadrant pain that started last night around 2230  Pt states the pain radiates into her back and she has had nausea and vomiting with the pain

## 2016-09-19 NOTE — ED Notes (Signed)
Obtained urine clean catch and sent down by Reola MosherMartin Jamarie Mussa

## 2016-09-20 ENCOUNTER — Encounter: Payer: Self-pay | Admitting: Neurology

## 2017-07-02 ENCOUNTER — Other Ambulatory Visit: Payer: Self-pay | Admitting: Neurosurgery

## 2017-07-02 DIAGNOSIS — Q046 Congenital cerebral cysts: Secondary | ICD-10-CM

## 2017-07-19 ENCOUNTER — Other Ambulatory Visit: Payer: Self-pay

## 2017-07-19 ENCOUNTER — Ambulatory Visit
Admission: RE | Admit: 2017-07-19 | Discharge: 2017-07-19 | Disposition: A | Discharge status: Home / Self Care | Payer: Medicaid Other | Source: Ambulatory Visit | Attending: Neurosurgery | Admitting: Neurosurgery

## 2017-07-19 DIAGNOSIS — Q046 Congenital cerebral cysts: Secondary | ICD-10-CM

## 2017-07-19 MED ORDER — GADOBENATE DIMEGLUMINE 529 MG/ML IV SOLN
20.0000 mL | Freq: Once | INTRAVENOUS | Status: AC | PRN
  Administered 2017-07-19: 20 mL via INTRAVENOUS

## 2017-11-30 ENCOUNTER — Other Ambulatory Visit: Payer: Self-pay

## 2017-11-30 ENCOUNTER — Encounter (HOSPITAL_BASED_OUTPATIENT_CLINIC_OR_DEPARTMENT_OTHER): Payer: Self-pay | Admitting: *Deleted

## 2017-11-30 ENCOUNTER — Emergency Department (HOSPITAL_BASED_OUTPATIENT_CLINIC_OR_DEPARTMENT_OTHER)
Admission: EM | Admit: 2017-11-30 | Discharge: 2017-11-30 | Disposition: A | Discharge status: Home / Self Care | Payer: BLUE CROSS/BLUE SHIELD | Attending: Physician Assistant | Admitting: Physician Assistant

## 2017-11-30 DIAGNOSIS — I10 Essential (primary) hypertension: Secondary | ICD-10-CM | POA: Diagnosis not present

## 2017-11-30 DIAGNOSIS — N921 Excessive and frequent menstruation with irregular cycle: Secondary | ICD-10-CM | POA: Insufficient documentation

## 2017-11-30 DIAGNOSIS — Z79899 Other long term (current) drug therapy: Secondary | ICD-10-CM | POA: Diagnosis not present

## 2017-11-30 DIAGNOSIS — N939 Abnormal uterine and vaginal bleeding, unspecified: Secondary | ICD-10-CM | POA: Diagnosis present

## 2017-11-30 DIAGNOSIS — Z7984 Long term (current) use of oral hypoglycemic drugs: Secondary | ICD-10-CM | POA: Diagnosis not present

## 2017-11-30 DIAGNOSIS — Z8742 Personal history of other diseases of the female genital tract: Secondary | ICD-10-CM | POA: Insufficient documentation

## 2017-11-30 DIAGNOSIS — E119 Type 2 diabetes mellitus without complications: Secondary | ICD-10-CM | POA: Diagnosis not present

## 2017-11-30 LAB — CBC WITH DIFFERENTIAL/PLATELET
Basophils Absolute: 0 10*3/uL (ref 0.0–0.1)
Basophils Relative: 0 %
Eosinophils Absolute: 0.1 10*3/uL (ref 0.0–0.7)
Eosinophils Relative: 2 %
HCT: 42 % (ref 36.0–46.0)
Hemoglobin: 13.8 g/dL (ref 12.0–15.0)
Lymphocytes Relative: 28 %
Lymphs Abs: 2.5 10*3/uL (ref 0.7–4.0)
MCH: 28.9 pg (ref 26.0–34.0)
MCHC: 32.9 g/dL (ref 30.0–36.0)
MCV: 87.9 fL (ref 78.0–100.0)
Monocytes Absolute: 0.5 10*3/uL (ref 0.1–1.0)
Monocytes Relative: 5 %
Neutro Abs: 5.9 10*3/uL (ref 1.7–7.7)
Neutrophils Relative %: 65 %
Platelets: 211 10*3/uL (ref 150–400)
RBC: 4.78 MIL/uL (ref 3.87–5.11)
RDW: 13.4 % (ref 11.5–15.5)
WBC: 9 10*3/uL (ref 4.0–10.5)

## 2017-11-30 LAB — WET PREP, GENITAL
Clue Cells Wet Prep HPF POC: NONE SEEN
Sperm: NONE SEEN
Trich, Wet Prep: NONE SEEN
Yeast Wet Prep HPF POC: NONE SEEN

## 2017-11-30 LAB — COMPREHENSIVE METABOLIC PANEL
ALT: 26 U/L (ref 14–54)
AST: 24 U/L (ref 15–41)
Albumin: 3.8 g/dL (ref 3.5–5.0)
Alkaline Phosphatase: 68 U/L (ref 38–126)
Anion gap: 8 (ref 5–15)
BUN: 10 mg/dL (ref 6–20)
CO2: 27 mmol/L (ref 22–32)
Calcium: 8.8 mg/dL — ABNORMAL LOW (ref 8.9–10.3)
Chloride: 103 mmol/L (ref 101–111)
Creatinine, Ser: 0.57 mg/dL (ref 0.44–1.00)
GFR calc Af Amer: >60 mL/min (ref >60)
GFR calc non Af Amer: >60 mL/min (ref >60)
Glucose, Bld: 139 mg/dL — ABNORMAL HIGH (ref 65–99)
Potassium: 3.3 mmol/L — ABNORMAL LOW (ref 3.5–5.1)
Sodium: 138 mmol/L (ref 135–145)
Total Bilirubin: 1.1 mg/dL (ref 0.3–1.2)
Total Protein: 7.6 g/dL (ref 6.5–8.1)

## 2017-11-30 LAB — PREGNANCY, URINE: Preg Test, Ur: NEGATIVE

## 2017-11-30 MED ORDER — MEDROXYPROGESTERONE ACETATE 10 MG PO TABS: 10.0000 mg | ORAL_TABLET | Freq: Every day | ORAL | 0 refills | Status: DC

## 2017-11-30 MED ORDER — KETOROLAC TROMETHAMINE 30 MG/ML IJ SOLN
30.0000 mg | Freq: Once | INTRAMUSCULAR | Status: AC
  Administered 2017-11-30: 30 mg via INTRAVENOUS
  Filled 2017-11-30: qty 1

## 2017-11-30 NOTE — ED Provider Notes (Signed)
[facility] EMERGENCY DEPARTMENT Provider Note   CSN: [id number] Arrival date & time: 11/30/17  1508     History   Chief Complaint Chief Complaint  Patient presents with  . Vaginal Bleeding    HPI April Carson is a 51 y.o. female.  HPI April Carson is a 51 y.o. female with history of diabetes, hypertension, endometriosis, history of heavy vaginal bleeding, presents to the ED with complaint of heavy vaginal bleeding.  Patient states that her OB/GYN saw her 2 months ago, at which time she stated that her periods have been irregular, thinking she may be going through menopause, and he started her on birth control.  Patient states that the first month she did not have a period at all.  She states in the last 2 weeks she has been spotting lightly.  She states 2 days ago she has developed heavier vaginal bleeding that is even worsened today.  She states she is soaking a super plus tampon every hour.  She states she took 3 birth control pills yesterday to see if it would help. She states she has had a history of this in the past and had to be placed on progesterone only tablets and then had to have an ablation which did not help her bleeding.  She denies any abdominal cramping.  She states that she is having a headache which started today.  She is also feeling lightheaded.  She took tramadol this morning for headache which has not helped.  Past Medical History:  Diagnosis Date  . Allergy   . Anxiety   . Cyst of brain   . Diabetes mellitus without complication (HCC)   . Endometriosis   . Hives   . Hypertension   . Recurrent UTI    history of  . RLS (restless legs syndrome)     Patient Active Problem List   Diagnosis Date Noted  . ETD (eustachian tube dysfunction) 01/09/2014  . Vertigo 12/28/2013  . Abnormal CT of brain 08/24/2013  . Diabetes (HCC) 08/13/2013  . HTN (hypertension) 08/13/2013  . Cervical abnormality 08/13/2013  . FH: melanoma 08/13/2013  . Anxiety state,  unspecified 08/13/2013  . Reactive airway disease 08/13/2013  . RLS (restless legs syndrome) 08/13/2013    Past Surgical History:  Procedure Laterality Date  . CERVICAL ABLATION    . COMBINED HYSTEROSCOPY DIAGNOSTIC / D&C    . DILATION AND CURETTAGE OF UTERUS  1989, 1995, 1996  . expoloratory laporotomy    . LAPAROSCOPIC ABDOMINAL EXPLORATION    . NOVASURE ABLATION      OB History    No data available       Home Medications    Prior to Admission medications   Medication Sig Start Date End Date Taking? Authorizing Provider  Canagliflozin-Metformin HCl (INVOKAMET) 150-500 MG TABS Take 1 tablet by mouth daily.   Yes [provider]  fexofenadine (ALLEGRA) 180 MG tablet Take 180 mg by mouth daily.   Yes [provider]  FLUoxetine (PROZAC) 20 MG capsule Take 20 mg by mouth daily. 09/10/16  Yes [provider]  glucose blood test strip 1 each by Other route as needed for other. Use as instructed   Yes [provider]  hydrochlorothiazide (HYDRODIURIL) 25 MG tablet Take 25 mg by mouth daily. 09/05/16  Yes [provider]  Multiple Vitamin (MULTIVITAMIN WITH MINERALS) TABS tablet Take 1 tablet by mouth daily.   Yes [provider]  niacin 500 MG tablet Take 500-1,000 mg  by mouth 2 (two) times daily. Take 1000 mg in the morning and 500 mgs at night   Yes [provider]  ondansetron (ZOFRAN ODT) 8 MG disintegrating tablet Take 1 tablet (8 mg total) by mouth every 8 (eight) hours as needed for nausea or vomiting. 09/19/16  Yes Cathren Laine, MD  OVER THE COUNTER MEDICATION Take 1 tablet by mouth daily. Marshmallow root   Yes [provider]  pregabalin (LYRICA) 75 MG capsule Take 75 mg by mouth 2 (two) times daily.   Yes [provider]  Probiotic Product (PROBIOTIC PO) Take 2 capsules by mouth daily.   Yes [provider]  rOPINIRole (REQUIP) 1 MG tablet TAKE 1 TABLET BY MOUTH TWICE DAILY 11/08/14  Yes  Joaquim Nam, MD  rOPINIRole (REQUIP) 1 MG tablet Take 1 mg by mouth 3 (three) times daily. 09/09/16  Yes [provider]  sitaGLIPtin (JANUVIA) 100 MG tablet Take 100 mg by mouth daily.   Yes [provider]  canagliflozin (INVOKANA) 100 MG TABS tablet Take 100 mg by mouth daily before breakfast.    [provider]  Canagliflozin (INVOKANA) 100 MG TABS Take 300 mg by mouth daily.     [provider]  cyanocobalamin 1000 MCG tablet Take 100 mcg by mouth daily.    [provider]  HORIZANT 300 MG TBCR TAKE ONE TABLET BY MOUTH AT BEDTIME & MAY INCREASE TO 2 TABLETS EACH NIGHT AFTER 2 WEEKS. 03/16/15   Huston Foley, MD  HYDROcodone-acetaminophen (NORCO) 5-325 MG per tablet Take 1 tablet by mouth every 6 (six) hours as needed. 08/03/14   Toy Cookey, MD  HYDROcodone-acetaminophen (NORCO) 7.5-325 MG tablet Take 0.5-1 tablets by mouth every 6 (six) hours as needed. 08/14/16   [provider]  HYDROcodone-acetaminophen (NORCO/VICODIN) 5-325 MG tablet Take 1-2 tablets by mouth every 6 (six) hours as needed for moderate pain. 09/19/16   Cathren Laine, MD  Levonorgestrel-Ethinyl Estradiol (SEASONIQUE) 0.15-0.03 &0.01 MG tablet Take 1 tablet by mouth daily.    [provider]  Multiple Vitamin (MULTIVITAMIN WITH MINERALS) TABS tablet Take 1 tablet by mouth daily. menopause supplement    [provider]  Multiple Vitamin (MULTIVITAMIN WITH MINERALS) TABS tablet Take 1 tablet by mouth daily.    [provider]  OVER THE COUNTER MEDICATION Take 1 capsule by mouth daily. Biocleanse    [provider]  UNABLE TO FIND Take 1 tablet by mouth daily. Med Name: Beautiful Legs vitamins.    [provider]    Family History Family History  Problem Relation Age of Onset  . Cancer Other   . Hypertension Other   . Diabetes Other   . Diabetes Mother   . Cancer Father        melanoma  . Heart attack Maternal  Grandmother   . Heart attack Maternal Grandfather   . Cancer Paternal Grandmother   . Heart attack Paternal Grandfather     Social History Social History   Tobacco Use  . Smoking status: Never Smoker  . Smokeless tobacco: Never Used  Substance Use Topics  . Alcohol use: No    Comment: rarely  . Drug use: No     Allergies   Multihance [gadobenate] and Aloe   Review of Systems Review of Systems  Constitutional: Negative for chills and fever.  Respiratory: Negative for cough, chest tightness and shortness of breath.   Cardiovascular: Negative for chest pain, palpitations and leg swelling.  Gastrointestinal: Negative for abdominal  pain, diarrhea, nausea and vomiting.  Genitourinary: Positive for vaginal bleeding.  Musculoskeletal: Negative for arthralgias, myalgias, neck pain and neck stiffness.  Skin: Negative for rash.  Neurological: Positive for light-headedness and headaches. Negative for weakness.  All other systems reviewed and are negative.    Physical Exam Updated Vital Signs BP (!) 122/98 (BP Location: Left Arm)   Pulse 95   Temp 98.2 F (36.8 C) (Oral)   Resp 18   Ht 5\' 4"  (1.626 m)   Wt 111.6 kg (246 lb)   LMP 11/28/2017   SpO2 100%   BMI 42.23 kg/m   Physical Exam  Constitutional: She appears well-developed and well-nourished. No distress.  HENT:  Head: Normocephalic.  Eyes: Conjunctivae are normal.  Neck: Neck supple.  Cardiovascular: Normal rate, regular rhythm and normal heart sounds.  Pulmonary/Chest: Effort normal and breath sounds normal. No respiratory distress. She has no wheezes. She has no rales.  Abdominal: Soft. Bowel sounds are normal. She exhibits no distension. There is no tenderness. There is no rebound.  Genitourinary:  Genitourinary Comments: Normal external genitalia.  Vaginal bleeding present, moderate.  No hemorrhaging.  Normal cervix, closed.  No cervical motion tenderness.  No uterine or adnexal tenderness. No masses. Exam  limited by body habitus  Musculoskeletal: She exhibits no edema.  Neurological: She is alert.  Skin: Skin is warm and dry.  Psychiatric: She has a normal mood and affect. Her behavior is normal.  Nursing note and vitals reviewed.    ED Treatments / Results  Labs (all labs ordered are listed, but only abnormal results are displayed) Labs Reviewed  WET PREP, GENITAL - Abnormal; Notable for the following components:      Result Value   WBC, Wet Prep HPF POC MODERATE (*)    All other components within normal limits  COMPREHENSIVE METABOLIC PANEL - Abnormal; Notable for the following components:   Potassium 3.3 (*)    Glucose, Bld 139 (*)    Calcium 8.8 (*)    All other components within normal limits  CBC WITH DIFFERENTIAL/PLATELET  PREGNANCY, URINE  GC/CHLAMYDIA PROBE AMP ([place]    EKG  EKG Interpretation None       Radiology No results found.  Procedures Procedures (including critical care time)  Medications Ordered in ED Medications  ketorolac (TORADOL) 30 MG/ML injection 30 mg (not administered)     Initial Impression / Assessment and Plan / ED Course  I have reviewed the triage vital signs and the nursing notes.  Pertinent labs & imaging results that were available during my care of the patient were reviewed by me and considered in my medical decision making (see chart for details).     Patient in emergency department with heavy vaginal bleeding onset 3 days ago, worsened today.  Soaking super plus tampon every hour.  I will perform pelvic exam.  Her vital signs are normal at this time.  Will check hemoglobin and hematocrit.  She does not have any abdominal pain.  Will get a urine pregnancy test as well.  Patient is currently on birth control which were just started 2 months ago.  She apparently took 3 birth control pills yesterday as well.  H&H normal. Bleeding is moderate, but no hemorrhaging. Will start on progesterone. Will have follow up  with OB/GYN for further treatment.   Signed out at shift change pending urine preg  Vitals:   11/30/17 1528 11/30/17 1534 11/30/17 1801  BP: (!) 122/98  (!) 143/69  Pulse: 95  89  Resp: 18  16  Temp: 98.2 F (36.8 C)    TempSrc: Oral    SpO2: 100%  100%  Weight:  111.6 kg (246 lb)   Height:  5\' 4"  (1.626 m)      Final Clinical Impressions(s) / ED Diagnoses   Final diagnoses:  Menorrhagia with irregular cycle    ED Discharge Orders        Ordered    medroxyPROGESTERone (PROVERA) 10 MG tablet  Daily     11/30/17 1640       Jaynie CrumbleKirichenko, Isabele Lollar, PA-C 12/03/17 1240    Mackuen, Cindee Saltourteney Lyn, MD 12/04/17 [phone]

## 2017-11-30 NOTE — ED Triage Notes (Signed)
Pt states she has been taking birth control to regulate her periods. Reports she has been spotting for the last 2 weeks. On Friday she began bleeding heavily. States she took double dose of her birth control yesterday to try to control the bleeding but has been bleeding through a super tampon and pad every hour

## 2017-11-30 NOTE — Discharge Instructions (Signed)
Take provera as prescribed until all gone. Do not take your birth control at the same time. You may get some rebound bleeding after you finish medication. Follow up with OB/GYN

## 2017-11-30 NOTE — ED Provider Notes (Signed)
Received signout from PA Kirichenko.  Refer to provider note for full history and physical examination.  Briefly patient is a 51 year old female with a history of diabetes, HTN, endometriosis, and history of heavy vaginal bleeding presents to the ED with complaint of 2 weeks of vaginal spotting which worsened acutely 2 days ago.  Workup is reassuring.  We are awaiting urine pregnancy.  If urine pregnancy is negative, patient stable for discharge home with medroxyprogesterone and OB/GYN follow-up.  If urine pregnancy is positive, she will require further evaluation and workup for rule out of ectopic pregnancy.  17:00 Urine pregnancy negative, she is stable for discharge home.   Jeanie SewerFawze, Laquanda Bick A, PA-C 12/01/17 1532    Abelino DerrickMackuen, Courteney Lyn, MD 12/02/17 [phone]

## 2017-12-01 LAB — GC/CHLAMYDIA PROBE AMP (~~LOC~~) NOT AT ARMC
Chlamydia: NEGATIVE
Neisseria Gonorrhea: NEGATIVE

## 2018-01-09 ENCOUNTER — Other Ambulatory Visit: Payer: Self-pay

## 2018-01-09 ENCOUNTER — Emergency Department (HOSPITAL_BASED_OUTPATIENT_CLINIC_OR_DEPARTMENT_OTHER): Payer: BLUE CROSS/BLUE SHIELD

## 2018-01-09 ENCOUNTER — Encounter (HOSPITAL_BASED_OUTPATIENT_CLINIC_OR_DEPARTMENT_OTHER): Payer: Self-pay

## 2018-01-09 ENCOUNTER — Emergency Department (HOSPITAL_BASED_OUTPATIENT_CLINIC_OR_DEPARTMENT_OTHER)
Admission: EM | Admit: 2018-01-09 | Discharge: 2018-01-10 | Disposition: A | Discharge status: Home / Self Care | Payer: BLUE CROSS/BLUE SHIELD | Attending: Emergency Medicine | Admitting: Emergency Medicine

## 2018-01-09 DIAGNOSIS — Z7984 Long term (current) use of oral hypoglycemic drugs: Secondary | ICD-10-CM | POA: Diagnosis not present

## 2018-01-09 DIAGNOSIS — I10 Essential (primary) hypertension: Secondary | ICD-10-CM | POA: Diagnosis not present

## 2018-01-09 DIAGNOSIS — E876 Hypokalemia: Secondary | ICD-10-CM | POA: Insufficient documentation

## 2018-01-09 DIAGNOSIS — J181 Lobar pneumonia, unspecified organism: Secondary | ICD-10-CM | POA: Diagnosis not present

## 2018-01-09 DIAGNOSIS — J189 Pneumonia, unspecified organism: Secondary | ICD-10-CM

## 2018-01-09 DIAGNOSIS — R05 Cough: Secondary | ICD-10-CM | POA: Diagnosis present

## 2018-01-09 DIAGNOSIS — Z79899 Other long term (current) drug therapy: Secondary | ICD-10-CM | POA: Insufficient documentation

## 2018-01-09 DIAGNOSIS — E119 Type 2 diabetes mellitus without complications: Secondary | ICD-10-CM | POA: Insufficient documentation

## 2018-01-09 LAB — I-STAT CG4 LACTIC ACID, ED: Lactic Acid, Venous: 0.93 mmol/L (ref 0.5–1.9)

## 2018-01-09 MED ORDER — IBUPROFEN 800 MG PO TABS
800.0000 mg | ORAL_TABLET | Freq: Once | ORAL | Status: AC
  Administered 2018-01-09: 800 mg via ORAL
  Filled 2018-01-09: qty 1

## 2018-01-09 MED ORDER — ACETAMINOPHEN 325 MG PO TABS
650.0000 mg | ORAL_TABLET | Freq: Once | ORAL | Status: AC | PRN
  Administered 2018-01-09: 650 mg via ORAL
  Filled 2018-01-09: qty 2

## 2018-01-09 NOTE — ED Triage Notes (Signed)
Pt arrived via [facility]. PT having flu like symptoms that started 2 days ago. EMS report pt is tachycardic and BGL 160mg ./dcl. Pt also having R flank pain.

## 2018-01-09 NOTE — ED Notes (Signed)
Pt states EDP has not been in to evaluate; pt resting comfortably at this time

## 2018-01-09 NOTE — ED Notes (Signed)
ED Provider at bedside. 

## 2018-01-09 NOTE — ED Provider Notes (Signed)
[facility] EMERGENCY DEPARTMENT Provider Note   CSN: [id number] Arrival date & time: 01/09/18  4098     History   Chief Complaint Chief Complaint  Patient presents with  . Influenza    HPI April Carson is a 52 y.o. female.  HPI Patient reports she has been having cough, nasal congestion and drainage with body aches that started Monday.  She reports initially symptoms were not too severe.  She reports a progressed however she started getting severe body aches and fatigue.  Cough got much worse and became productive of yellow sputum.  Reports she then started feeling generalized weakness.  She also notes some increased urinary symptoms and slight right flank pain.  Reports she has had frequent urinary tract infections in the past.  This feels similar.  She denies abdominal pain or nausea or vomiting.  She reports she is continued to take in fluids and stay hydrated.  She reports she has continued to take her regular prescribed medications. Past Medical History:  Diagnosis Date  . Allergy   . Anxiety   . Cyst of brain   . Diabetes mellitus without complication (HCC)   . Endometriosis   . Hives   . Hypertension   . Recurrent UTI    history of  . RLS (restless legs syndrome)     Patient Active Problem List   Diagnosis Date Noted  . ETD (eustachian tube dysfunction) 01/09/2014  . Vertigo 12/28/2013  . Abnormal CT of brain 08/24/2013  . Diabetes (HCC) 08/13/2013  . HTN (hypertension) 08/13/2013  . Cervical abnormality 08/13/2013  . FH: melanoma 08/13/2013  . Anxiety state, unspecified 08/13/2013  . Reactive airway disease 08/13/2013  . RLS (restless legs syndrome) 08/13/2013    Past Surgical History:  Procedure Laterality Date  . CERVICAL ABLATION    . COMBINED HYSTEROSCOPY DIAGNOSTIC / D&C    . DILATION AND CURETTAGE OF UTERUS  1989, 1995, 1996  . expoloratory laporotomy    . LAPAROSCOPIC ABDOMINAL EXPLORATION    . NOVASURE ABLATION      OB History    No data available       Home Medications    Prior to Admission medications   Medication Sig Start Date End Date Taking? Authorizing Provider  acetaminophen (TYLENOL) 500 MG tablet Take 2 tablets (1,000 mg total) by mouth every 6 (six) hours as needed. 01/10/18   Arby Barrette, MD  azithromycin (ZITHROMAX) 250 MG tablet Take 1 tablet (250 mg total) by mouth daily. 01/10/18   Arby Barrette, MD  canagliflozin (INVOKANA) 100 MG TABS tablet Take 100 mg by mouth daily before breakfast.    [provider]  Canagliflozin (INVOKANA) 100 MG TABS Take 300 mg by mouth daily.     [provider]  Canagliflozin-Metformin HCl (INVOKAMET) 150-500 MG TABS Take 1 tablet by mouth daily.    [provider]  cyanocobalamin 1000 MCG tablet Take 100 mcg by mouth daily.    [provider]  fexofenadine (ALLEGRA) 180 MG tablet Take 180 mg by mouth daily.    [provider]  FLUoxetine (PROZAC) 20 MG capsule Take 20 mg by mouth daily. 09/10/16   [provider]  glucose blood test strip 1 each by Other route as needed for other. Use as instructed    [provider]  HORIZANT 300 MG TBCR TAKE ONE TABLET BY MOUTH AT BEDTIME & MAY INCREASE TO 2 TABLETS EACH NIGHT AFTER 2 WEEKS. 03/16/15   Huston Foley, MD  hydrochlorothiazide (HYDRODIURIL) 25 MG tablet Take 25 mg by mouth daily. 09/05/16   [provider]  HYDROcodone-acetaminophen (NORCO) 5-325 MG per tablet Take 1 tablet by mouth every 6 (six) hours as needed. 08/03/14   Toy Cookey, MD  HYDROcodone-acetaminophen (NORCO) 7.5-325 MG tablet Take 0.5-1 tablets by mouth every 6 (six) hours as needed. 08/14/16   [provider]  HYDROcodone-acetaminophen (NORCO/VICODIN) 5-325 MG tablet Take 1-2 tablets by mouth every 6 (six) hours as needed for moderate pain. 09/19/16   Cathren Laine, MD  HYDROcodone-homatropine [facility]) 5-1.5 MG/5ML syrup 1-2 teaspoon every 6 hours as needed for cough 01/10/18    Arby Barrette, MD  ibuprofen (ADVIL,MOTRIN) 800 MG tablet Take 1 tablet (800 mg total) by mouth 3 (three) times daily. 01/10/18   Arby Barrette, MD  Levonorgestrel-Ethinyl Estradiol (SEASONIQUE) 0.15-0.03 &0.01 MG tablet Take 1 tablet by mouth daily.    [provider]  medroxyPROGESTERone (PROVERA) 10 MG tablet Take 1 tablet (10 mg total) by mouth daily. 11/30/17   Kirichenko, Lemont Fillers, PA-C  Multiple Vitamin (MULTIVITAMIN WITH MINERALS) TABS tablet Take 1 tablet by mouth daily.    [provider]  Multiple Vitamin (MULTIVITAMIN WITH MINERALS) TABS tablet Take 1 tablet by mouth daily. menopause supplement    [provider]  Multiple Vitamin (MULTIVITAMIN WITH MINERALS) TABS tablet Take 1 tablet by mouth daily.    [provider]  niacin 500 MG tablet Take 500-1,000 mg by mouth 2 (two) times daily. Take 1000 mg in the morning and 500 mgs at night    [provider]  ondansetron (ZOFRAN ODT) 8 MG disintegrating tablet Take 1 tablet (8 mg total) by mouth every 8 (eight) hours as needed for nausea or vomiting. 09/19/16   Cathren Laine, MD  OVER THE COUNTER MEDICATION Take 1 tablet by mouth daily. Marshmallow root    [provider]  OVER THE COUNTER MEDICATION Take 1 capsule by mouth daily. Biocleanse    [provider]  potassium chloride SA (K-DUR,KLOR-CON) 20 MEQ tablet Take 1 tablet (20 mEq total) by mouth 2 (two) times daily. 01/10/18   Arby Barrette, MD  pregabalin (LYRICA) 75 MG capsule Take 75 mg by mouth 2 (two) times daily.    [provider]  Probiotic Product (PROBIOTIC PO) Take 2 capsules by mouth daily.    [provider]  rOPINIRole (REQUIP) 1 MG tablet TAKE 1 TABLET BY MOUTH TWICE DAILY 11/08/14   Joaquim Nam, MD  rOPINIRole (REQUIP) 1 MG tablet Take 1 mg by mouth 3 (three) times daily. 09/09/16   [provider]  sitaGLIPtin (JANUVIA) 100 MG tablet Take 100 mg by mouth daily.    [provider]  UNABLE TO FIND Take 1 tablet by mouth daily. Med Name: [employer] vitamins.    [provider]    Family History Family History  Problem Relation Age of Onset  . Cancer Other   . Hypertension Other   . Diabetes Other   . Diabetes Mother   . Cancer Father        melanoma  . Heart attack Maternal Grandmother   . Heart attack Maternal Grandfather   . Cancer Paternal Grandmother   . Heart attack Paternal Grandfather     Social History Social History   Tobacco Use  . Smoking status: Never Smoker  . Smokeless tobacco: Never Used  Substance Use Topics  . Alcohol use: Yes    Comment: rarely  . Drug use: No  Allergies   Multihance [gadobenate] and Aloe   Review of Systems Review of Systems 10 Systems reviewed and are negative for acute change except as noted in the HPI.   Physical Exam Updated Vital Signs BP 126/71 (BP Location: Left Arm)   Pulse 93   Temp 99.2 F (37.3 C) (Oral)   Resp 20   Ht 5\' 4"  (1.626 m)   Wt 111.6 kg (246 lb)   LMP 12/02/2017 (Within Weeks)   SpO2 93%   BMI 42.23 kg/m   Physical Exam  Constitutional: She is oriented to person, place, and time.  Patient is alert and appropriate.  She is diaphoretic and mild to moderately ill in appearance.  Mental status is clear.  Intermittent dry cough.  No respiratory distress.  HENT:  Head: Normocephalic and atraumatic.  Right Ear: External ear normal.  Left Ear: External ear normal.  Nose: Nose normal.  Mouth/Throat: Oropharynx is clear and moist.  Eyes: EOM are normal.  Mild conjunctival injection bilaterally.  Neck: Neck supple.  Cardiovascular: Normal rate, regular rhythm, normal heart sounds and intact distal pulses.  Pulmonary/Chest: Effort normal.  Occasional harsh cough.  Good airflow on the right without wheeze rhonchi or rale.  Rales on the left base.  Good airflow.  Abdominal: Soft. She exhibits no distension. There is no tenderness.  Musculoskeletal:  Normal range of motion. She exhibits no edema or tenderness.  Neurological: She is alert and oriented to person, place, and time. No cranial nerve deficit. She exhibits normal muscle tone. Coordination normal.  Skin: Skin is warm.  Patient is warm and slightly diaphoretic.  No rashes.  Psychiatric: She has a normal mood and affect.     ED Treatments / Results  Labs (all labs ordered are listed, but only abnormal results are displayed) Labs Reviewed  URINALYSIS, ROUTINE W REFLEX MICROSCOPIC - Abnormal; Notable for the following components:      Result Value   Glucose, UA >=500 (*)    Ketones, ur 15 (*)    All other components within normal limits  BASIC METABOLIC PANEL - Abnormal; Notable for the following components:   Potassium 2.8 (*)    Glucose, Bld 132 (*)    Calcium 8.5 (*)    All other components within normal limits  URINALYSIS, MICROSCOPIC (REFLEX) - Abnormal; Notable for the following components:   Bacteria, UA FEW (*)    Squamous Epithelial / LPF 0-5 (*)    All other components within normal limits  URINE CULTURE  CBC WITH DIFFERENTIAL/PLATELET  I-STAT CG4 LACTIC ACID, ED  I-STAT CG4 LACTIC ACID, ED    EKG  EKG Interpretation None       Radiology Dg Chest 2 View  Result Date: 01/10/2018 CLINICAL DATA:  Flu-like symptoms for 2 days. EXAM: CHEST  2 VIEW COMPARISON:  08/03/2014 FINDINGS: Patchy airspace disease in the left lingula likely representing pneumonia. Right lung is clear. Heart size and pulmonary vascularity are normal. No pleural effusions. No pneumothorax. IMPRESSION: Patchy airspace disease in the left lingula likely representing pneumonia. Electronically Signed   By: Burman NievesWilliam  Stevens M.D.   On: 01/10/2018 00:01    Procedures Procedures (including critical care time)  Medications Ordered in ED Medications  cefTRIAXone (ROCEPHIN) 1 g in dextrose 5 % 50 mL IVPB (not administered)  azithromycin (ZITHROMAX) 500 mg in dextrose 5 % 250 mL IVPB (not  administered)  ipratropium-albuterol (DUONEB) 0.5-2.5 (3) MG/3ML nebulizer solution 3 mL (3 mLs Nebulization Given 01/10/18 0017)  potassium chloride SA (  K-DUR,KLOR-CON) CR tablet 40 mEq (not administered)  albuterol (PROVENTIL HFA;VENTOLIN HFA) 108 (90 Base) MCG/ACT inhaler 2 puff (not administered)  acetaminophen (TYLENOL) tablet 650 mg (650 mg Oral Given 01/09/18 1904)  ibuprofen (ADVIL,MOTRIN) tablet 800 mg (800 mg Oral Given 01/09/18 2348)     Initial Impression / Assessment and Plan / ED Course  I have reviewed the triage vital signs and the nursing notes.  Pertinent labs & imaging results that were available during my care of the patient were reviewed by me and considered in my medical decision making (see chart for details).      Final Clinical Impressions(s) / ED Diagnoses   Final diagnoses:  Community acquired pneumonia of left upper lobe of lung (HCC)  Patient has cough and fever.  Physical exam is positive for rales on the left side which is consistent with chest x-ray finding of left-sided infiltrate.  We will treat the patient for community-acquired pneumonia.  Patient has normal vital signs.  She denies any nausea or vomiting.  She has been able to consistently take her medications for diabetes and hypertension.  No lactic acidosis, no leukocytosis.  It has hypokalemia but blood sugars controlled.  At this time, she is stable for outpatient treatment.  Return precautions reviewed.  ED Discharge Orders        Ordered    azithromycin (ZITHROMAX) 250 MG tablet  Daily     01/10/18 0034    HYDROcodone-homatropine (HYCODAN) 5-1.5 MG/5ML syrup     01/10/18 0034    ibuprofen (ADVIL,MOTRIN) 800 MG tablet  3 times daily     01/10/18 0034    acetaminophen (TYLENOL) 500 MG tablet  Every 6 hours PRN     01/10/18 0034    potassium chloride SA (K-DUR,KLOR-CON) 20 MEQ tablet  2 times daily     01/10/18 0036       Arby Barrette, MD 01/10/18 [phone]

## 2018-01-10 LAB — URINALYSIS, ROUTINE W REFLEX MICROSCOPIC
Bilirubin Urine: NEGATIVE
Glucose, UA: >=500 mg/dL — AB
Hgb urine dipstick: NEGATIVE
Ketones, ur: 15 mg/dL — AB
Leukocytes, UA: NEGATIVE
Nitrite: NEGATIVE
Protein, ur: NEGATIVE mg/dL
Specific Gravity, Urine: 1.025 (ref 1.005–1.030)
pH: 6 (ref 5.0–8.0)

## 2018-01-10 LAB — CBC WITH DIFFERENTIAL/PLATELET
Basophils Absolute: 0 10*3/uL (ref 0.0–0.1)
Basophils Relative: 0 %
Eosinophils Absolute: 0 10*3/uL (ref 0.0–0.7)
Eosinophils Relative: 1 %
HCT: 42.3 % (ref 36.0–46.0)
Hemoglobin: 14 g/dL (ref 12.0–15.0)
Lymphocytes Relative: 26 %
Lymphs Abs: 1.5 10*3/uL (ref 0.7–4.0)
MCH: 28.5 pg (ref 26.0–34.0)
MCHC: 33.1 g/dL (ref 30.0–36.0)
MCV: 86.2 fL (ref 78.0–100.0)
Monocytes Absolute: 0.4 10*3/uL (ref 0.1–1.0)
Monocytes Relative: 7 %
Neutro Abs: 3.6 10*3/uL (ref 1.7–7.7)
Neutrophils Relative %: 66 %
Platelets: 157 10*3/uL (ref 150–400)
RBC: 4.91 MIL/uL (ref 3.87–5.11)
RDW: 13.5 % (ref 11.5–15.5)
WBC: 5.5 10*3/uL (ref 4.0–10.5)

## 2018-01-10 LAB — BASIC METABOLIC PANEL
Anion gap: 11 (ref 5–15)
BUN: 12 mg/dL (ref 6–20)
CO2: 25 mmol/L (ref 22–32)
Calcium: 8.5 mg/dL — ABNORMAL LOW (ref 8.9–10.3)
Chloride: 101 mmol/L (ref 101–111)
Creatinine, Ser: 0.66 mg/dL (ref 0.44–1.00)
GFR calc Af Amer: >60 mL/min (ref >60)
GFR calc non Af Amer: >60 mL/min (ref >60)
Glucose, Bld: 132 mg/dL — ABNORMAL HIGH (ref 65–99)
Potassium: 2.8 mmol/L — ABNORMAL LOW (ref 3.5–5.1)
Sodium: 137 mmol/L (ref 135–145)

## 2018-01-10 LAB — URINALYSIS, MICROSCOPIC (REFLEX)

## 2018-01-10 MED ORDER — AZITHROMYCIN 250 MG PO TABS: 250.0000 mg | ORAL_TABLET | Freq: Every day | ORAL | 0 refills | Status: DC

## 2018-01-10 MED ORDER — AZITHROMYCIN 500 MG IV SOLR
INTRAVENOUS | Status: AC
  Filled 2018-01-10: qty 500

## 2018-01-10 MED ORDER — POTASSIUM CHLORIDE CRYS ER 20 MEQ PO TBCR
40.0000 meq | EXTENDED_RELEASE_TABLET | Freq: Once | ORAL | Status: AC
  Administered 2018-01-10: 40 meq via ORAL
  Filled 2018-01-10: qty 2

## 2018-01-10 MED ORDER — IPRATROPIUM-ALBUTEROL 0.5-2.5 (3) MG/3ML IN SOLN
3.0000 mL | Freq: Four times a day (QID) | RESPIRATORY_TRACT | Status: DC
  Administered 2018-01-10: 3 mL via RESPIRATORY_TRACT
  Filled 2018-01-10: qty 3

## 2018-01-10 MED ORDER — HYDROCODONE-HOMATROPINE 5-1.5 MG/5ML PO SYRP: ORAL_SOLUTION | ORAL | 0 refills | Status: DC

## 2018-01-10 MED ORDER — IBUPROFEN 800 MG PO TABS: 800.0000 mg | ORAL_TABLET | Freq: Three times a day (TID) | ORAL | 0 refills | Status: AC

## 2018-01-10 MED ORDER — ONDANSETRON HCL 4 MG/2ML IJ SOLN
INTRAMUSCULAR | Status: AC
  Administered 2018-01-10: 4 mg
  Filled 2018-01-10: qty 2

## 2018-01-10 MED ORDER — ALBUTEROL SULFATE HFA 108 (90 BASE) MCG/ACT IN AERS
2.0000 | INHALATION_SPRAY | Freq: Once | RESPIRATORY_TRACT | Status: AC
  Administered 2018-01-10: 2 via RESPIRATORY_TRACT
  Filled 2018-01-10: qty 6.7

## 2018-01-10 MED ORDER — CEFTRIAXONE SODIUM 1 G IJ SOLR
1.0000 g | Freq: Once | INTRAMUSCULAR | Status: AC
  Administered 2018-01-10: 1 g via INTRAVENOUS
  Filled 2018-01-10: qty 10

## 2018-01-10 MED ORDER — ACETAMINOPHEN 500 MG PO TABS: 1000.0000 mg | ORAL_TABLET | Freq: Four times a day (QID) | ORAL | 0 refills | Status: AC | PRN

## 2018-01-10 MED ORDER — POTASSIUM CHLORIDE CRYS ER 20 MEQ PO TBCR: 20.0000 meq | EXTENDED_RELEASE_TABLET | Freq: Two times a day (BID) | ORAL | 0 refills | Status: DC

## 2018-01-10 MED ORDER — DEXTROSE 5 % IV SOLN
500.0000 mg | Freq: Once | INTRAVENOUS | Status: AC
  Administered 2018-01-10: 500 mg via INTRAVENOUS
  Filled 2018-01-10: qty 500

## 2018-01-12 LAB — URINE CULTURE

## 2018-02-11 ENCOUNTER — Encounter (HOSPITAL_COMMUNITY): Payer: Self-pay | Admitting: Emergency Medicine

## 2018-02-11 ENCOUNTER — Other Ambulatory Visit: Payer: Self-pay

## 2018-02-11 ENCOUNTER — Emergency Department (HOSPITAL_COMMUNITY)
Admission: EM | Admit: 2018-02-11 | Discharge: 2018-02-11 | Disposition: A | Discharge status: Home / Self Care | Payer: BLUE CROSS/BLUE SHIELD | Attending: Emergency Medicine | Admitting: Emergency Medicine

## 2018-02-11 ENCOUNTER — Emergency Department (HOSPITAL_COMMUNITY): Payer: BLUE CROSS/BLUE SHIELD

## 2018-02-11 DIAGNOSIS — N760 Acute vaginitis: Secondary | ICD-10-CM | POA: Insufficient documentation

## 2018-02-11 DIAGNOSIS — R0602 Shortness of breath: Secondary | ICD-10-CM | POA: Insufficient documentation

## 2018-02-11 DIAGNOSIS — R509 Fever, unspecified: Secondary | ICD-10-CM | POA: Diagnosis present

## 2018-02-11 DIAGNOSIS — Z7984 Long term (current) use of oral hypoglycemic drugs: Secondary | ICD-10-CM | POA: Diagnosis not present

## 2018-02-11 DIAGNOSIS — E119 Type 2 diabetes mellitus without complications: Secondary | ICD-10-CM | POA: Insufficient documentation

## 2018-02-11 DIAGNOSIS — I10 Essential (primary) hypertension: Secondary | ICD-10-CM | POA: Diagnosis not present

## 2018-02-11 DIAGNOSIS — J020 Streptococcal pharyngitis: Secondary | ICD-10-CM | POA: Insufficient documentation

## 2018-02-11 DIAGNOSIS — E876 Hypokalemia: Secondary | ICD-10-CM | POA: Diagnosis not present

## 2018-02-11 DIAGNOSIS — Z79899 Other long term (current) drug therapy: Secondary | ICD-10-CM | POA: Diagnosis not present

## 2018-02-11 DIAGNOSIS — R0682 Tachypnea, not elsewhere classified: Secondary | ICD-10-CM | POA: Insufficient documentation

## 2018-02-11 DIAGNOSIS — B9689 Other specified bacterial agents as the cause of diseases classified elsewhere: Secondary | ICD-10-CM

## 2018-02-11 LAB — COMPREHENSIVE METABOLIC PANEL
ALT: 30 U/L (ref 14–54)
AST: 27 U/L (ref 15–41)
Albumin: 4 g/dL (ref 3.5–5.0)
Alkaline Phosphatase: 70 U/L (ref 38–126)
Anion gap: 13 (ref 5–15)
BUN: 11 mg/dL (ref 6–20)
CO2: 23 mmol/L (ref 22–32)
Calcium: 9.3 mg/dL (ref 8.9–10.3)
Chloride: 100 mmol/L — ABNORMAL LOW (ref 101–111)
Creatinine, Ser: 0.63 mg/dL (ref 0.44–1.00)
GFR calc Af Amer: >60 mL/min (ref >60)
GFR calc non Af Amer: >60 mL/min (ref >60)
Glucose, Bld: 158 mg/dL — ABNORMAL HIGH (ref 65–99)
Potassium: 2.7 mmol/L — CL (ref 3.5–5.1)
Sodium: 136 mmol/L (ref 135–145)
Total Bilirubin: 1.3 mg/dL — ABNORMAL HIGH (ref 0.3–1.2)
Total Protein: 7.7 g/dL (ref 6.5–8.1)

## 2018-02-11 LAB — URINALYSIS, ROUTINE W REFLEX MICROSCOPIC
Bacteria, UA: NONE SEEN
Bilirubin Urine: NEGATIVE
Glucose, UA: >=500 mg/dL — AB
Ketones, ur: 5 mg/dL — AB
Leukocytes, UA: NEGATIVE
Nitrite: NEGATIVE
Protein, ur: 30 mg/dL — AB
Specific Gravity, Urine: 1.033 — ABNORMAL HIGH (ref 1.005–1.030)
pH: 5 (ref 5.0–8.0)

## 2018-02-11 LAB — CBC WITH DIFFERENTIAL/PLATELET
Basophils Absolute: 0 10*3/uL (ref 0.0–0.1)
Basophils Relative: 0 %
Eosinophils Absolute: 0 10*3/uL (ref 0.0–0.7)
Eosinophils Relative: 0 %
HCT: 45.8 % (ref 36.0–46.0)
Hemoglobin: 14.7 g/dL (ref 12.0–15.0)
Lymphocytes Relative: 12 %
Lymphs Abs: 1.4 10*3/uL (ref 0.7–4.0)
MCH: 27.9 pg (ref 26.0–34.0)
MCHC: 32.1 g/dL (ref 30.0–36.0)
MCV: 87.1 fL (ref 78.0–100.0)
Monocytes Absolute: 0.6 10*3/uL (ref 0.1–1.0)
Monocytes Relative: 5 %
Neutro Abs: 9.5 10*3/uL — ABNORMAL HIGH (ref 1.7–7.7)
Neutrophils Relative %: 83 %
Platelets: 198 10*3/uL (ref 150–400)
RBC: 5.26 MIL/uL — ABNORMAL HIGH (ref 3.87–5.11)
RDW: 14.1 % (ref 11.5–15.5)
WBC: 11.6 10*3/uL — ABNORMAL HIGH (ref 4.0–10.5)

## 2018-02-11 LAB — D-DIMER, QUANTITATIVE: D-Dimer, Quant: 0.97 ug{FEU}/mL — ABNORMAL HIGH (ref 0.00–0.50)

## 2018-02-11 LAB — RAPID HIV SCREEN (HIV 1/2 AB+AG)
HIV 1/2 Antibodies: NONREACTIVE
HIV-1 P24 Antigen - HIV24: NONREACTIVE

## 2018-02-11 LAB — RAPID STREP SCREEN (MED CTR MEBANE ONLY): Streptococcus, Group A Screen (Direct): POSITIVE — AB

## 2018-02-11 LAB — WET PREP, GENITAL
Sperm: NONE SEEN
Trich, Wet Prep: NONE SEEN
Yeast Wet Prep HPF POC: NONE SEEN

## 2018-02-11 LAB — I-STAT CG4 LACTIC ACID, ED: Lactic Acid, Venous: 1.25 mmol/L (ref 0.5–1.9)

## 2018-02-11 LAB — INFLUENZA PANEL BY PCR (TYPE A & B)
Influenza A By PCR: NEGATIVE
Influenza B By PCR: NEGATIVE

## 2018-02-11 MED ORDER — IBUPROFEN 800 MG PO TABS
800.0000 mg | ORAL_TABLET | Freq: Once | ORAL | Status: AC
  Administered 2018-02-11: 800 mg via ORAL
  Filled 2018-02-11: qty 1

## 2018-02-11 MED ORDER — SODIUM CHLORIDE 0.9 % IV BOLUS (SEPSIS)
1000.0000 mL | Freq: Once | INTRAVENOUS | Status: AC
  Administered 2018-02-11: 1000 mL via INTRAVENOUS

## 2018-02-11 MED ORDER — HYDROCODONE-ACETAMINOPHEN 7.5-325 MG/15ML PO SOLN: 10.0000 mL | Freq: Four times a day (QID) | ORAL | 0 refills | Status: DC | PRN

## 2018-02-11 MED ORDER — POTASSIUM CHLORIDE CRYS ER 20 MEQ PO TBCR: 20.0000 meq | EXTENDED_RELEASE_TABLET | Freq: Every day | ORAL | 0 refills | Status: AC

## 2018-02-11 MED ORDER — ALBUTEROL SULFATE HFA 108 (90 BASE) MCG/ACT IN AERS
2.0000 | INHALATION_SPRAY | RESPIRATORY_TRACT | Status: DC | PRN
  Administered 2018-02-11: 2 via RESPIRATORY_TRACT
  Filled 2018-02-11: qty 6.7

## 2018-02-11 MED ORDER — PENICILLIN G BENZATHINE 1200000 UNIT/2ML IM SUSP
1.2000 10*6.[IU] | Freq: Once | INTRAMUSCULAR | Status: AC
  Administered 2018-02-11: 1.2 10*6.[IU] via INTRAMUSCULAR
  Filled 2018-02-11: qty 2

## 2018-02-11 MED ORDER — METRONIDAZOLE 500 MG PO TABS: 500.0000 mg | ORAL_TABLET | Freq: Two times a day (BID) | ORAL | 0 refills | Status: DC

## 2018-02-11 MED ORDER — MORPHINE SULFATE (PF) 4 MG/ML IV SOLN
4.0000 mg | Freq: Once | INTRAVENOUS | Status: AC
  Administered 2018-02-11: 4 mg via INTRAVENOUS
  Filled 2018-02-11: qty 1

## 2018-02-11 MED ORDER — POTASSIUM CHLORIDE CRYS ER 20 MEQ PO TBCR
40.0000 meq | EXTENDED_RELEASE_TABLET | Freq: Once | ORAL | Status: AC
  Administered 2018-02-11: 40 meq via ORAL
  Filled 2018-02-11: qty 2

## 2018-02-11 MED ORDER — ONDANSETRON HCL 4 MG/2ML IJ SOLN
4.0000 mg | Freq: Once | INTRAMUSCULAR | Status: AC
  Administered 2018-02-11: 4 mg via INTRAVENOUS
  Filled 2018-02-11: qty 2

## 2018-02-11 NOTE — ED Provider Notes (Signed)
[facility]-EMERGENCY DEPT Provider Note   CSN: [id number] Arrival date & time: 02/11/18  1801     History   Chief Complaint Chief Complaint  Patient presents with  . Fever  . Shortness of Breath    HPI April Fillerseressa Damiano is a 52 y.o. female.  HPI   52 year old female with history of diabetes, anxiety, hypertension who was recently diagnosed with pneumonia presenting for evaluation of fever and shortness of breath.  Patient brought here via EMS.  Patient report a little over a month ago she developed fever, chills, productive cough, and shortness of breath.  She was seen in the ED on 01/09/18 for her symptoms.  At that time, patient was diagnosed with community-acquired pneumonia and subsequently discharged home with a Z-Pak.  She took the medication for a week without any improvement.  She follow-up with her primary care provider a week from her initial ER visit who recommend bed rest.  Patient was bed rest for 10 days but no improvement.  She was seen by her PCP again and was started on levofloxacin for her symptoms.  She took the medication which did mildly improve but within the past 10 days her symptoms become progressively worse.  She endorsed generalized fatigue, weakness, decrease in appetite, is mostly bed rest, feeling drowsy and for the past few days running a fever.  She continues to endorse persistent cough.  She also mentioned having vaginal rawness and itchiness for the same duration.  She was given nystatin medication by her PCP 2 weeks ago but no improvement.  Today she is sent here for her PCP office due to progressive worsening symptoms.  No prior history of PE or DVT, no recent travel.  Did not have a flu shot.  Does complain of sore throat.  Past Medical History:  Diagnosis Date  . Allergy   . Anxiety   . Cyst of brain   . Diabetes mellitus without complication (HCC)   . Endometriosis   . Hives   . Hypertension   . Recurrent UTI    history of  .  RLS (restless legs syndrome)     Patient Active Problem List   Diagnosis Date Noted  . ETD (eustachian tube dysfunction) 01/09/2014  . Vertigo 12/28/2013  . Abnormal CT of brain 08/24/2013  . Diabetes (HCC) 08/13/2013  . HTN (hypertension) 08/13/2013  . Cervical abnormality 08/13/2013  . FH: melanoma 08/13/2013  . Anxiety state, unspecified 08/13/2013  . Reactive airway disease 08/13/2013  . RLS (restless legs syndrome) 08/13/2013    Past Surgical History:  Procedure Laterality Date  . CERVICAL ABLATION    . COMBINED HYSTEROSCOPY DIAGNOSTIC / D&C    . DILATION AND CURETTAGE OF UTERUS  1989, 1995, 1996  . expoloratory laporotomy    . LAPAROSCOPIC ABDOMINAL EXPLORATION    . NOVASURE ABLATION      OB History    No data available       Home Medications    Prior to Admission medications   Medication Sig Start Date End Date Taking? Authorizing Provider  acetaminophen (TYLENOL) 500 MG tablet Take 2 tablets (1,000 mg total) by mouth every 6 (six) hours as needed. 01/10/18   Arby BarrettePfeiffer, Marcy, MD  azithromycin (ZITHROMAX) 250 MG tablet Take 1 tablet (250 mg total) by mouth daily. 01/10/18   Arby BarrettePfeiffer, Marcy, MD  canagliflozin (INVOKANA) 100 MG TABS tablet Take 100 mg by mouth daily before breakfast.    [provider]  Canagliflozin (INVOKANA) 100 MG  TABS Take 300 mg by mouth daily.     [provider]  Canagliflozin-Metformin HCl (INVOKAMET) 150-500 MG TABS Take 1 tablet by mouth daily.    [provider]  cyanocobalamin 1000 MCG tablet Take 100 mcg by mouth daily.    [provider]  fexofenadine (ALLEGRA) 180 MG tablet Take 180 mg by mouth daily.    [provider]  FLUoxetine (PROZAC) 20 MG capsule Take 20 mg by mouth daily. 09/10/16   [provider]  glucose blood test strip 1 each by Other route as needed for other. Use as instructed    [provider]  HORIZANT 300 MG TBCR TAKE ONE TABLET BY MOUTH AT BEDTIME & MAY  INCREASE TO 2 TABLETS EACH NIGHT AFTER 2 WEEKS. 03/16/15   Huston Foley, MD  hydrochlorothiazide (HYDRODIURIL) 25 MG tablet Take 25 mg by mouth daily. 09/05/16   [provider]  HYDROcodone-acetaminophen (NORCO) 5-325 MG per tablet Take 1 tablet by mouth every 6 (six) hours as needed. 08/03/14   Toy Cookey, MD  HYDROcodone-acetaminophen (NORCO) 7.5-325 MG tablet Take 0.5-1 tablets by mouth every 6 (six) hours as needed. 08/14/16   [provider]  HYDROcodone-acetaminophen (NORCO/VICODIN) 5-325 MG tablet Take 1-2 tablets by mouth every 6 (six) hours as needed for moderate pain. 09/19/16   Cathren Laine, MD  HYDROcodone-homatropine [place]) 5-1.5 MG/5ML syrup 1-2 teaspoon every 6 hours as needed for cough 01/10/18   Arby Barrette, MD  ibuprofen (ADVIL,MOTRIN) 800 MG tablet Take 1 tablet (800 mg total) by mouth 3 (three) times daily. 01/10/18   Arby Barrette, MD  Levonorgestrel-Ethinyl Estradiol (SEASONIQUE) 0.15-0.03 &0.01 MG tablet Take 1 tablet by mouth daily.    [provider]  medroxyPROGESTERone (PROVERA) 10 MG tablet Take 1 tablet (10 mg total) by mouth daily. 11/30/17   Kirichenko, Lemont Fillers, PA-C  Multiple Vitamin (MULTIVITAMIN WITH MINERALS) TABS tablet Take 1 tablet by mouth daily.    [provider]  Multiple Vitamin (MULTIVITAMIN WITH MINERALS) TABS tablet Take 1 tablet by mouth daily. menopause supplement    [provider]  Multiple Vitamin (MULTIVITAMIN WITH MINERALS) TABS tablet Take 1 tablet by mouth daily.    [provider]  niacin 500 MG tablet Take 500-1,000 mg by mouth 2 (two) times daily. Take 1000 mg in the morning and 500 mgs at night    [provider]  ondansetron (ZOFRAN ODT) 8 MG disintegrating tablet Take 1 tablet (8 mg total) by mouth every 8 (eight) hours as needed for nausea or vomiting. 09/19/16   Cathren Laine, MD  OVER THE COUNTER MEDICATION Take 1 tablet by mouth daily. Marshmallow root    [provider]  OVER THE COUNTER MEDICATION Take 1 capsule by mouth daily. Biocleanse    [provider]  potassium chloride SA (K-DUR,KLOR-CON) 20 MEQ tablet Take 1 tablet (20 mEq total) by mouth 2 (two) times daily. 01/10/18   Arby Barrette, MD  pregabalin (LYRICA) 75 MG capsule Take 75 mg by mouth 2 (two) times daily.    [provider]  Probiotic Product (PROBIOTIC PO) Take 2 capsules by mouth daily.    [provider]  rOPINIRole (REQUIP) 1 MG tablet TAKE 1 TABLET BY MOUTH TWICE DAILY 11/08/14   Joaquim Nam, MD  rOPINIRole (REQUIP) 1 MG tablet Take 1 mg by mouth 3 (three) times daily. 09/09/16   [provider]  sitaGLIPtin (JANUVIA) 100 MG tablet Take 100 mg by mouth daily.    [provider]  UNABLE TO FIND Take 1 tablet by mouth daily. Med Name: Beautiful Legs vitamins.    [provider]    Family History Family History  Problem Relation Age of Onset  . Cancer Other   . Hypertension Other   . Diabetes Other   . Diabetes Mother   . Cancer Father        melanoma  . Heart attack Maternal Grandmother   . Heart attack Maternal Grandfather   . Cancer Paternal Grandmother   . Heart attack Paternal Grandfather     Social History Social History   Tobacco Use  . Smoking status: Never Smoker  . Smokeless tobacco: Never Used  Substance Use Topics  . Alcohol use: Yes    Comment: rarely  . Drug use: No     Allergies   Multihance [gadobenate] and Aloe   Review of Systems Review of Systems  All other systems reviewed and are negative.    Physical Exam Updated Vital Signs SpO2 95%   Physical Exam  Constitutional: She is oriented to person, place, and time. She appears well-developed and well-nourished.  Obese female, appears uncomfortable and ill-appearing  HENT:  Head: Atraumatic.  Face is flushed. Ears: Normal TMs bilaterally Nose: Normal nares Throat: Posterior oropharyngeal erythema, uvula is midline  no tonsillar enlargement or exudates, no trismus   Eyes: Conjunctivae and EOM are normal. Pupils are equal, round, and reactive to light.  Neck: Normal range of motion. Neck supple.  No nuchal rigidity  Cardiovascular:  Tachycardia without murmur rubs or gallops  Pulmonary/Chest: Tachypnea noted. She has no wheezes. She has rhonchi. She has no rales.  Mild tachypnea, nonproductive cough, scattered rhonchi heard without wheezes or rales  Abdominal: Soft. There is no tenderness.  Genitourinary:  Genitourinary Comments: Chaperone present during exam.  No inguinal lymphopathy or inguinal hernia noted.  Mildly macerated skin noted around the labia majora with white discharge.  Mild discomfort with speculum insertion, moderate amount of curd-like white discharge noted in vaginal vault.  Close cervical loss free of lesion or rash.  On bimanual examination, left adnexal tenderness without cervical motion tenderness.  Musculoskeletal:  The patient is globally weak with poor effort  Neurological: She is alert and oriented to person, place, and time.  Skin: No rash noted.  Psychiatric: She has a normal mood and affect.  Nursing note and vitals reviewed.    ED Treatments / Results  Labs (all labs ordered are listed, but only abnormal results are displayed) Labs Reviewed  RAPID STREP SCREEN (NOT AT [facility]) - Abnormal; Notable for the following components:      Result Value   Streptococcus, Group A Screen (Direct) POSITIVE (*)    All other components within normal limits  WET PREP, GENITAL - Abnormal; Notable for the following components:   Clue Cells Wet Prep HPF POC PRESENT (*)    WBC, Wet Prep HPF POC MANY (*)    All other components within normal limits  CBC WITH DIFFERENTIAL/PLATELET - Abnormal; Notable for the following components:   WBC 11.6 (*)    RBC 5.26 (*)    Neutro Abs 9.5 (*)    All other components within normal limits  COMPREHENSIVE METABOLIC PANEL - Abnormal; Notable for the  following components:   Potassium 2.7 (*)    Chloride 100 (*)    Glucose, Bld 158 (*)    Total Bilirubin 1.3 (*)    All other components within normal limits  URINALYSIS, ROUTINE W REFLEX  MICROSCOPIC - Abnormal; Notable for the following components:   Color, Urine AMBER (*)    APPearance CLOUDY (*)    Specific Gravity, Urine 1.033 (*)    Glucose, UA >=500 (*)    Hgb urine dipstick SMALL (*)    Ketones, ur 5 (*)    Protein, ur 30 (*)    Squamous Epithelial / LPF 0-5 (*)    All other components within normal limits  D-DIMER, QUANTITATIVE (NOT AT [facility]) - Abnormal; Notable for the following components:   D-Dimer, Quant 0.97 (*)    All other components within normal limits  RAPID HIV SCREEN (HIV 1/2 AB+AG)  INFLUENZA PANEL BY PCR (TYPE A & B)  RPR  I-STAT CG4 LACTIC ACID, ED  GC/CHLAMYDIA PROBE AMP ([place]) NOT AT [facility]    EKG  EKG Interpretation None     ED ECG REPORT   Date: 02/11/2018  Rate: 98  Rhythm: normal sinus rhythm  QRS Axis: right  Intervals: normal  ST/T Wave abnormalities: normal  Conduction Disutrbances:none  Narrative Interpretation:   Old EKG Reviewed: unchanged  I have personally reviewed the EKG tracing and agree with the computerized printout as noted.   Radiology Dg Chest 2 View  Result Date: 02/11/2018 CLINICAL DATA:  Fever of 103.0, shortness of breath, dry cough, malaise. EXAM: CHEST  2 VIEW COMPARISON:  None. FINDINGS: Poor inspiration. Normal sized heart. Clear lungs. Diffuse peribronchial thickening. Mild scoliosis, possibly positional. IMPRESSION: Mild bronchitic changes. Electronically Signed   By: Beckie Salts M.D.   On: 02/11/2018 19:21    Procedures Procedures (including critical care time)  Medications Ordered in ED Medications  ibuprofen (ADVIL,MOTRIN) tablet 800 mg (not administered)  albuterol (PROVENTIL HFA;VENTOLIN HFA) 108 (90 Base) MCG/ACT inhaler 2 puff (not administered)  sodium chloride 0.9 % bolus 1,000 mL (1,000  mLs Intravenous New Bag/Given 02/11/18 2019)  morphine 4 MG/ML injection 4 mg (4 mg Intravenous Given 02/11/18 2019)  ondansetron (ZOFRAN) injection 4 mg (4 mg Intravenous Given 02/11/18 2019)  potassium chloride SA (K-DUR,KLOR-CON) CR tablet 40 mEq (40 mEq Oral Given 02/11/18 2123)  penicillin g benzathine (BICILLIN LA) 1200000 UNIT/2ML injection 1.2 Million Units (1.2 Million Units Intramuscular Given 02/11/18 2124)     Initial Impression / Assessment and Plan / ED Course  I have reviewed the triage vital signs and the nursing notes.  Pertinent labs & imaging results that were available during my care of the patient were reviewed by me and considered in my medical decision making (see chart for details).     BP 100/81 (BP Location: Right Arm)   Pulse (!) 104   Temp (!) 102.5 F (39.2 C) (Oral) Comment: informed the nurse and PA  Resp 15   SpO2 97%    Final Clinical Impressions(s) / ED Diagnoses   Final diagnoses:  Strep pharyngitis  Bacterial vaginosis  Hypokalemia    ED Discharge Orders        Ordered    metroNIDAZOLE (FLAGYL) 500 MG tablet  2 times daily     02/11/18 2256    HYDROcodone-acetaminophen (HYCET) 7.5-325 mg/15 ml solution  Every 6 hours PRN     02/11/18 2256    potassium chloride SA (K-DUR,KLOR-CON) 20 MEQ tablet  Daily     02/11/18 2256     6:51 PM Patient has been feeling sick for more than a month and is becoming progressively worse.  She is here with fever, tachycardia, and mild tachypnea, oxygen is at 90% on room air  patient was given supplemental oxygen with improvement of her oxygenation.  She has myalgias and diffusely tender throughout.  She has an erythematous posterior oropharyngeal region and she also complaining of having vaginal rash not while treated with nystatin.  Workup initiated.  9:17 PM Mildly elevated WBC of 11.6 but normal lactic acid.  Normal hemoglobin.  Potassium level is low at 2.7 and will need supplementation.  Will check EKG.   D-dimer is mildly elevated at 0.97 however patient is allergic to IV contrast I and after discussing risk and benefit, my suspicion for PE is low therefore we will not obtain chest CT angiogram.  Her urine did not show signs of urinary tract infection however her strep test is positive.  Patient given Bicillin IM, flu test is negative.  Chest x-ray shows mild bronchitic changes but no focal infiltrate concerning for pneumonia.  10:43 PM Wet prep shows evidence of clue cells and many WBC.  Will treat with Flagyl.  At this time patient declined STI prophylaxis antibiotic.  Patient able to keep her potassium supplemented down.  EKG without evidence of U waves.  Will prescribe potassium supplementation and will have patient have her potassium level rechecked in a week.  10:52 PM Temperature is 102.5.  Ibuprofen given.  When ambulating patient maintain oxygen at 100% on room air.  At this time, patient will be discharged home with appropriate treatment.  Return precautions discussed.  Recommend follow-up with PCP for further care.  Pt did not meet sepsis criteria.    Fayrene Helper, PA-C 02/11/18 2302    Donnetta Hutching, MD 02/13/18 2042

## 2018-02-11 NOTE — ED Triage Notes (Addendum)
Per EMS, pt presents with 103.0 fever today, SOB, dry cough, general malaise. Sent urgent care so "she could be seen quicker". Had 1000 mg acetaminophen en route

## 2018-02-11 NOTE — ED Notes (Signed)
AMBULATED THE PT FROM THE ROOM TO THE RESTROOM AND BACK 99%-100% ON RETURN TO THE ROOM INFORMED THE PA.

## 2018-02-11 NOTE — ED Notes (Signed)
Bed: WA17 Expected date:  Expected time:  Means of arrival:  Comments: Restock so night shift doesn't get all mad at me

## 2018-02-11 NOTE — Discharge Instructions (Signed)
You have been evaluated for your sickness.  You have been diagnosed with strep throat.  Take Hycet as needed for fever and pain.  Drink plenty of fluid and stay hydrated.  Your potassium level is low.  Take potassium supplementation and eat a banana daily for 1 week.  Have your level recheck by your doctor in 1 week.  You also have bacterial vaginosis.  Take flagyl as prescribed.  Avoid drinking alcohol while taking antibiotic.  Return if you notice that your condition is worsen or if you have any concerns.

## 2018-02-12 LAB — GC/CHLAMYDIA PROBE AMP (~~LOC~~) NOT AT ARMC
Chlamydia: NEGATIVE
Neisseria Gonorrhea: NEGATIVE

## 2018-02-12 LAB — RPR: RPR Ser Ql: NONREACTIVE

## 2018-10-05 ENCOUNTER — Emergency Department (HOSPITAL_BASED_OUTPATIENT_CLINIC_OR_DEPARTMENT_OTHER)
Admission: EM | Admit: 2018-10-05 | Discharge: 2018-10-05 | Disposition: A | Discharge status: Home / Self Care | Payer: BLUE CROSS/BLUE SHIELD | Attending: Emergency Medicine | Admitting: Emergency Medicine

## 2018-10-05 ENCOUNTER — Encounter (HOSPITAL_BASED_OUTPATIENT_CLINIC_OR_DEPARTMENT_OTHER): Payer: Self-pay

## 2018-10-05 ENCOUNTER — Other Ambulatory Visit: Payer: Self-pay

## 2018-10-05 DIAGNOSIS — R112 Nausea with vomiting, unspecified: Secondary | ICD-10-CM | POA: Diagnosis not present

## 2018-10-05 DIAGNOSIS — I10 Essential (primary) hypertension: Secondary | ICD-10-CM | POA: Diagnosis not present

## 2018-10-05 DIAGNOSIS — Z7984 Long term (current) use of oral hypoglycemic drugs: Secondary | ICD-10-CM | POA: Insufficient documentation

## 2018-10-05 DIAGNOSIS — E119 Type 2 diabetes mellitus without complications: Secondary | ICD-10-CM | POA: Insufficient documentation

## 2018-10-05 DIAGNOSIS — Z79899 Other long term (current) drug therapy: Secondary | ICD-10-CM | POA: Insufficient documentation

## 2018-10-05 DIAGNOSIS — R1111 Vomiting without nausea: Secondary | ICD-10-CM

## 2018-10-05 LAB — CBC WITH DIFFERENTIAL/PLATELET
Abs Immature Granulocytes: 0.02 10*3/uL (ref 0.00–0.07)
Basophils Absolute: 0 10*3/uL (ref 0.0–0.1)
Basophils Relative: 0 %
Eosinophils Absolute: 0.1 10*3/uL (ref 0.0–0.5)
Eosinophils Relative: 1 %
HCT: 46.6 % — ABNORMAL HIGH (ref 36.0–46.0)
Hemoglobin: 14.5 g/dL (ref 12.0–15.0)
Immature Granulocytes: 0 %
Lymphocytes Relative: 28 %
Lymphs Abs: 2.5 10*3/uL (ref 0.7–4.0)
MCH: 25.8 pg — ABNORMAL LOW (ref 26.0–34.0)
MCHC: 31.1 g/dL (ref 30.0–36.0)
MCV: 82.9 fL (ref 80.0–100.0)
Monocytes Absolute: 0.5 10*3/uL (ref 0.1–1.0)
Monocytes Relative: 6 %
Neutro Abs: 5.7 10*3/uL (ref 1.7–7.7)
Neutrophils Relative %: 65 %
Platelets: 241 10*3/uL (ref 150–400)
RBC: 5.62 MIL/uL — ABNORMAL HIGH (ref 3.87–5.11)
RDW: 14.7 % (ref 11.5–15.5)
WBC: 8.7 10*3/uL (ref 4.0–10.5)
nRBC: 0 % (ref 0.0–0.2)

## 2018-10-05 LAB — URINALYSIS, ROUTINE W REFLEX MICROSCOPIC
Bilirubin Urine: NEGATIVE
Glucose, UA: >=500 mg/dL — AB
Hgb urine dipstick: NEGATIVE
Ketones, ur: 15 mg/dL — AB
Leukocytes, UA: NEGATIVE
Nitrite: NEGATIVE
Protein, ur: 30 mg/dL — AB
Specific Gravity, Urine: 1.025 (ref 1.005–1.030)
pH: 6 (ref 5.0–8.0)

## 2018-10-05 LAB — COMPREHENSIVE METABOLIC PANEL
ALT: 40 U/L (ref 0–44)
AST: 32 U/L (ref 15–41)
Albumin: 4.3 g/dL (ref 3.5–5.0)
Alkaline Phosphatase: 75 U/L (ref 38–126)
Anion gap: 13 (ref 5–15)
BUN: 15 mg/dL (ref 6–20)
CO2: 24 mmol/L (ref 22–32)
Calcium: 9.5 mg/dL (ref 8.9–10.3)
Chloride: 100 mmol/L (ref 98–111)
Creatinine, Ser: 0.61 mg/dL (ref 0.44–1.00)
GFR calc Af Amer: >60 mL/min (ref >60)
GFR calc non Af Amer: >60 mL/min (ref >60)
Glucose, Bld: 125 mg/dL — ABNORMAL HIGH (ref 70–99)
Potassium: 3.3 mmol/L — ABNORMAL LOW (ref 3.5–5.1)
Sodium: 137 mmol/L (ref 135–145)
Total Bilirubin: 1.7 mg/dL — ABNORMAL HIGH (ref 0.3–1.2)
Total Protein: 8.4 g/dL — ABNORMAL HIGH (ref 6.5–8.1)

## 2018-10-05 LAB — PREGNANCY, URINE: Preg Test, Ur: NEGATIVE

## 2018-10-05 LAB — URINALYSIS, MICROSCOPIC (REFLEX)

## 2018-10-05 LAB — CBG MONITORING, ED: Glucose-Capillary: 118 mg/dL — ABNORMAL HIGH (ref 70–99)

## 2018-10-05 MED ORDER — SODIUM CHLORIDE 0.9 % IV BOLUS
500.0000 mL | Freq: Once | INTRAVENOUS | Status: AC
  Administered 2018-10-05: 500 mL via INTRAVENOUS

## 2018-10-05 MED ORDER — SODIUM CHLORIDE 0.9 % IV BOLUS
1000.0000 mL | Freq: Once | INTRAVENOUS | Status: AC
  Administered 2018-10-05: 1000 mL via INTRAVENOUS

## 2018-10-05 MED ORDER — POTASSIUM CHLORIDE CRYS ER 20 MEQ PO TBCR
40.0000 meq | EXTENDED_RELEASE_TABLET | Freq: Once | ORAL | Status: AC
  Administered 2018-10-05: 40 meq via ORAL
  Filled 2018-10-05: qty 2

## 2018-10-05 MED ORDER — ONDANSETRON HCL 4 MG/2ML IJ SOLN
4.0000 mg | Freq: Once | INTRAMUSCULAR | Status: AC
  Administered 2018-10-05: 4 mg via INTRAVENOUS
  Filled 2018-10-05: qty 2

## 2018-10-05 MED ORDER — KETOROLAC TROMETHAMINE 15 MG/ML IJ SOLN
15.0000 mg | Freq: Once | INTRAMUSCULAR | Status: AC
  Administered 2018-10-05: 15 mg via INTRAVENOUS
  Filled 2018-10-05: qty 1

## 2018-10-05 MED ORDER — ONDANSETRON 4 MG PO TBDP: 4.0000 mg | ORAL_TABLET | Freq: Three times a day (TID) | ORAL | 0 refills | Status: AC | PRN

## 2018-10-05 MED FILL — ONDANSETRON ODT 4 MG TABLET: 4 | 3 days supply | Qty: 8 | Fill #0

## 2018-10-05 NOTE — ED Notes (Signed)
Pt standing in room with spouse. She states that her restless legs are causing her pain and making it difficult for her to lie in the bed.

## 2018-10-05 NOTE — ED Notes (Signed)
Pt provided something to drink per EDP. She states that she is no longer nauseated.

## 2018-10-05 NOTE — Discharge Instructions (Addendum)
Work-up has been reassuring.  You do look like you are mildly dehydrated.  Your potassium was little bit low needs to be rechecked.  Your urine does show signs of dehydration with proteins and ketones and should be rechecked with your primary care doctor.  Drink plenty of fluids stay hydrated.  Use of Zofran under your tongue for any ongoing nausea or vomiting.  Start using the Trulicity.  Follow-up with primary care this week for recheck return to ED with any worsening symptoms.

## 2018-10-05 NOTE — ED Provider Notes (Signed)
[facility] EMERGENCY DEPARTMENT Provider Note   CSN: [id number] Arrival date & time: 10/05/18  1008     History   Chief Complaint Chief Complaint  Patient presents with  . Emesis    HPI April Carson is a 52 y.o. female.  HPI 52 year old female past medical history significant for hypertension and diabetes along with restless leg syndrome presents to the emergency department today for evaluation of vomiting.  Patient states that yesterday she took her first dose of Trulicity.  She states that her primary care wanted to try different medication given her uncontrolled diabetes.  States that she took a low-dose of Trulicity one time several weeks ago and had some nausea with it.  However they up the medication and she took a second dose at the higher dosage yesterday.  She reports approximate 1 hour after taking the medication she became nauseated and started to have vomiting.  She states that she vomited 8-10 times since yesterday evening.  Patient states that she try to take a Phenergan without any relief because she vomited back up.  She has not been able take any of her medications today.  She reports bilateral leg pain that is consistent with a restless leg syndrome because she cannot take her medication for this because she continues to vomit.  She does report some loose stools without any blood.  She denies any hematemesis.  Patient denies any abdominal pain, urinary symptoms.  Denies any fevers or chills.  Denies any chest pain or shortness of breath.  Nothing makes her symptoms better or worse.  Pt denies any fever, chill, ha, vision changes, lightheadedness, dizziness, congestion, neck pain, cp, sob, cough, abd pain,  urinary symptoms, change in bowel habits, melena, hematochezia, lower extremity paresthesias.  Past Medical History:  Diagnosis Date  . Allergy   . Anxiety   . Cyst of brain   . Diabetes mellitus without complication (HCC)   . Endometriosis   . Hives     . Hypertension   . Recurrent UTI    history of  . RLS (restless legs syndrome)     Patient Active Problem List   Diagnosis Date Noted  . ETD (eustachian tube dysfunction) 01/09/2014  . Vertigo 12/28/2013  . Abnormal CT of brain 08/24/2013  . Diabetes (HCC) 08/13/2013  . HTN (hypertension) 08/13/2013  . Cervical abnormality 08/13/2013  . FH: melanoma 08/13/2013  . Anxiety state, unspecified 08/13/2013  . Reactive airway disease 08/13/2013  . RLS (restless legs syndrome) 08/13/2013    Past Surgical History:  Procedure Laterality Date  . CERVICAL ABLATION    . COMBINED HYSTEROSCOPY DIAGNOSTIC / D&C    . DILATION AND CURETTAGE OF UTERUS  1989, 1995, 1996  . expoloratory laporotomy    . LAPAROSCOPIC ABDOMINAL EXPLORATION    . NOVASURE ABLATION       OB History   None      Home Medications    Prior to Admission medications   Medication Sig Start Date End Date Taking? Authorizing Provider  acetaminophen (TYLENOL) 500 MG tablet Take 2 tablets (1,000 mg total) by mouth every 6 (six) hours as needed. Patient taking differently: Take 1,000 mg by mouth every 6 (six) hours as needed for mild pain, moderate pain, fever or headache.  01/10/18   Arby Barrette, MD  Canagliflozin-Metformin HCl (INVOKAMET) 150-500 MG TABS Take 1 tablet by mouth daily.    [provider]  cyanocobalamin 1000 MCG tablet Take 100 mcg by mouth daily.  [provider]  fexofenadine (ALLEGRA) 180 MG tablet Take 180 mg by mouth at bedtime.     [provider]  FLUoxetine (PROZAC) 20 MG capsule Take 20 mg by mouth daily. 09/10/16   [provider]  glucose blood test strip 1 each by Other route as needed for other. Use as instructed    [provider]  hydrochlorothiazide (HYDRODIURIL) 25 MG tablet Take 25 mg by mouth daily. 09/05/16   [provider]  HYDROcodone-acetaminophen (HYCET) 7.5-325 mg/15 ml solution Take 10 mLs by mouth every 6 (six) hours as  needed for moderate pain. 02/11/18   Fayrene Helper, PA-C  HYDROcodone-homatropine [facility]) 5-1.5 MG/5ML syrup 1-2 teaspoon every 6 hours as needed for cough 01/10/18   Arby Barrette, MD  ibuprofen (ADVIL,MOTRIN) 800 MG tablet Take 1 tablet (800 mg total) by mouth 3 (three) times daily. Patient taking differently: Take 800 mg by mouth every 6 (six) hours as needed for fever, headache, mild pain, moderate pain or cramping.  01/10/18   Arby Barrette, MD  levofloxacin (LEVAQUIN) 750 MG tablet TAKE 1 TABLET BY MOUTH EVERY DAY FOR 7 DAYS 01/29/18   [provider]  metroNIDAZOLE (FLAGYL) 500 MG tablet Take 1 tablet (500 mg total) by mouth 2 (two) times daily. 02/11/18   Fayrene Helper, PA-C  Multiple Vitamin (MULTIVITAMIN WITH MINERALS) TABS tablet Take 1 tablet by mouth daily. menopause supplement    [provider]  niacin 500 MG tablet Take 500-1,000 mg by mouth 2 (two) times daily. Take 500 mg in the morning and 1000 mgs at night    [provider]  ondansetron (ZOFRAN ODT) 4 MG disintegrating tablet Take 1 tablet (4 mg total) by mouth every 8 (eight) hours as needed for nausea or vomiting. 10/05/18   Rise Mu, PA-C  potassium chloride SA (K-DUR,KLOR-CON) 20 MEQ tablet Take 1 tablet (20 mEq total) by mouth daily. 02/11/18   Fayrene Helper, PA-C  pregabalin (LYRICA) 75 MG capsule Take 75 mg by mouth 2 (two) times daily.    [provider]  Probiotic Product (PROBIOTIC PO) Take 2 capsules by mouth daily.    [provider]  promethazine-dextromethorphan (PROMETHAZINE-DM) 6.25-15 MG/5ML syrup Take 5 ml by mouth every 6 hours prn for nause and vomiting 01/15/18   [provider]  rOPINIRole (REQUIP) 1 MG tablet TAKE 1 TABLET BY MOUTH TWICE DAILY 11/08/14   Joaquim Nam, MD  sitaGLIPtin (JANUVIA) 100 MG tablet Take 100 mg by mouth daily.    [provider]    Family History Family History  Problem Relation Age of Onset  . Cancer Other     . Hypertension Other   . Diabetes Other   . Diabetes Mother   . Cancer Father        melanoma  . Heart attack Maternal Grandmother   . Heart attack Maternal Grandfather   . Cancer Paternal Grandmother   . Heart attack Paternal Grandfather     Social History Social History   Tobacco Use  . Smoking status: Never Smoker  . Smokeless tobacco: Never Used  Substance Use Topics  . Alcohol use: Yes    Comment: rarely  . Drug use: No     Allergies   Multihance [gadobenate] and Aloe   Review of Systems Review of Systems  All other systems reviewed and are negative.    Physical Exam Updated Vital Signs BP (!) 120/53   Pulse 68   Temp 98.6 F (37 C) (  Oral)   Resp 16   Ht 5\' 4"  (1.626 m)   Wt 108.9 kg   LMP 09/15/2018   SpO2 99%   BMI 41.20 kg/m   Physical Exam  Constitutional: She is oriented to person, place, and time. She appears well-developed and well-nourished.  Non-toxic appearance. No distress.  HENT:  Head: Normocephalic and atraumatic.  Nose: Nose normal.  Mouth/Throat: Oropharynx is clear and moist.  Eyes: Pupils are equal, round, and reactive to light. Conjunctivae are normal. Right eye exhibits no discharge. Left eye exhibits no discharge.  Neck: Normal range of motion. Neck supple.  Cardiovascular: Normal rate, regular rhythm, normal heart sounds and intact distal pulses. Exam reveals no gallop and no friction rub.  No murmur heard. Pulmonary/Chest: Effort normal and breath sounds normal. No stridor. No respiratory distress. She has no wheezes. She has no rales. She exhibits no tenderness.  Abdominal: Soft. Bowel sounds are normal. There is no tenderness. There is no rebound and no guarding.  Musculoskeletal: Normal range of motion. She exhibits no tenderness.  No lower extremity edema, erythema or palpable cord.  Lymphadenopathy:    She has no cervical adenopathy.  Neurological: She is alert and oriented to person, place, and time.  Skin: Skin is  warm and dry. Capillary refill takes less than 2 seconds.  Psychiatric: Her behavior is normal. Judgment and thought content normal.  Nursing note and vitals reviewed.    ED Treatments / Results  Labs (all labs ordered are listed, but only abnormal results are displayed) Labs Reviewed  COMPREHENSIVE METABOLIC PANEL - Abnormal; Notable for the following components:      Result Value   Potassium 3.3 (*)    Glucose, Bld 125 (*)    Total Protein 8.4 (*)    Total Bilirubin 1.7 (*)    All other components within normal limits  CBC WITH DIFFERENTIAL/PLATELET - Abnormal; Notable for the following components:   RBC 5.62 (*)    HCT 46.6 (*)    MCH 25.8 (*)    All other components within normal limits  URINALYSIS, ROUTINE W REFLEX MICROSCOPIC - Abnormal; Notable for the following components:   APPearance HAZY (*)    Glucose, UA >=500 (*)    Ketones, ur 15 (*)    Protein, ur 30 (*)    All other components within normal limits  URINALYSIS, MICROSCOPIC (REFLEX) - Abnormal; Notable for the following components:   Bacteria, UA MANY (*)    All other components within normal limits  CBG MONITORING, ED - Abnormal; Notable for the following components:   Glucose-Capillary 118 (*)    All other components within normal limits  PREGNANCY, URINE    EKG None  Radiology No results found.  Procedures Procedures (including critical care time)  Medications Ordered in ED Medications  sodium chloride 0.9 % bolus 1,000 mL ( Intravenous Stopped 10/05/18 1256)  ondansetron (ZOFRAN) injection 4 mg (4 mg Intravenous Given 10/05/18 1155)  potassium chloride SA (K-DUR,KLOR-CON) CR tablet 40 mEq (40 mEq Oral Given 10/05/18 1339)  ketorolac (TORADOL) 15 MG/ML injection 15 mg (15 mg Intravenous Given 10/05/18 1343)  sodium chloride 0.9 % bolus 500 mL ( Intravenous Stopped 10/05/18 1435)     Initial Impression / Assessment and Plan / ED Course  I have reviewed the triage vital signs and the nursing  notes.  Pertinent labs & imaging results that were available during my care of the patient were reviewed by me and considered in my medical decision making (see  chart for details).     Patient presents to the ED for evaluation of nausea and vomiting in the setting of recently starting Trulicity yesterday evening.  Patient denies any other associated symptoms including abdominal pain, urinary symptoms, fevers, bloody stools.  On exam patient has no focal abdominal tenderness.  Heart regular rate and rhythm.  Lungs clear to auscultation bilaterally.  Lab work shows no significant dehydration.  She has no leukocytosis.  Normal hemoglobin.  No significant elect light derangement except for mild hypokalemia 3.3 which was replaced with oral potassium.  Glucose is mildly elevated at 125.  Normal bicarb and anion gap.  UA does show glucose, few ketones, few protein.  She does have many bacteria but is nitrite negative without any RBCs or WBCs no leukocyte esterase.  She denies any urinary symptoms.  Urine seems consistent with dehydration and asymptomatic bacteriuria.  Will not treat at this time.  Negative pregnancy test.  No signs of DKA at this time.  Patient was given fluids and Zofran in the ED.  She was also given Toradol for her bilateral leg pain due to her restless leg syndrome.  Patient had complete resolution in her nausea.  She states that she is able to tolerate p.o. fluids and food at this time and has no further vomiting.  I suspect the patient's nausea was likely secondary to her new medication that she started yesterday evening.  I intolerance and side effect of Trulicity.  I instructed patient to discontinue use of this medication and call her primary care doctor today for evaluation this week.  Discussed reasons to return the ED immediately.  Patient has no focal abdominal tenderness and is afebrile without any leukocytosis.  Doubt UTI, cholecystitis, pancreatitis, bowel obstruction, diverticulitis,  appendicitis, pyelonephritis, nephrolithiasis, PID.  Pt is hemodynamically stable, in NAD, & able to ambulate in the ED. Evaluation does not show pathology that would require ongoing emergent intervention or inpatient treatment. I explained the diagnosis to the patient. Pain has been managed & has no complaints prior to dc. Pt is comfortable with above plan and is stable for discharge at this time. All questions were answered prior to disposition. Strict return precautions for f/u to the ED were discussed. Encouraged follow up with PCP.   Final Clinical Impressions(s) / ED Diagnoses   Final diagnoses:  Vomiting without nausea, intractability of vomiting not specified, unspecified vomiting type    ED Discharge Orders         Ordered    ondansetron (ZOFRAN ODT) 4 MG disintegrating tablet  Every 8 hours PRN     10/05/18 1428           Wallace Keller 10/05/18 2137    Little, Ambrose Finland, MD 10/06/18 1810

## 2018-10-05 NOTE — ED Notes (Addendum)
Pts husband states that pt began vomiting last night  appprox.1 hour after taking trulicity. Pt denies abdominal pain. Pt took phenergan at 0730 today but vomited after taking.

## 2018-10-05 NOTE — ED Triage Notes (Signed)
Pt states started trulicity last night and has been vomiting since, c/o pain and restless leg d/t unable to keep down pain meds

## 2020-01-24 IMAGING — DX DG CHEST 2V
2 series · 2 of 2 positions shown · non-contrast
Comparison: 08/03/2014

CLINICAL DATA: Flu-like symptoms for 2 days.

EXAM:
CHEST  2 VIEW

[chest pa]
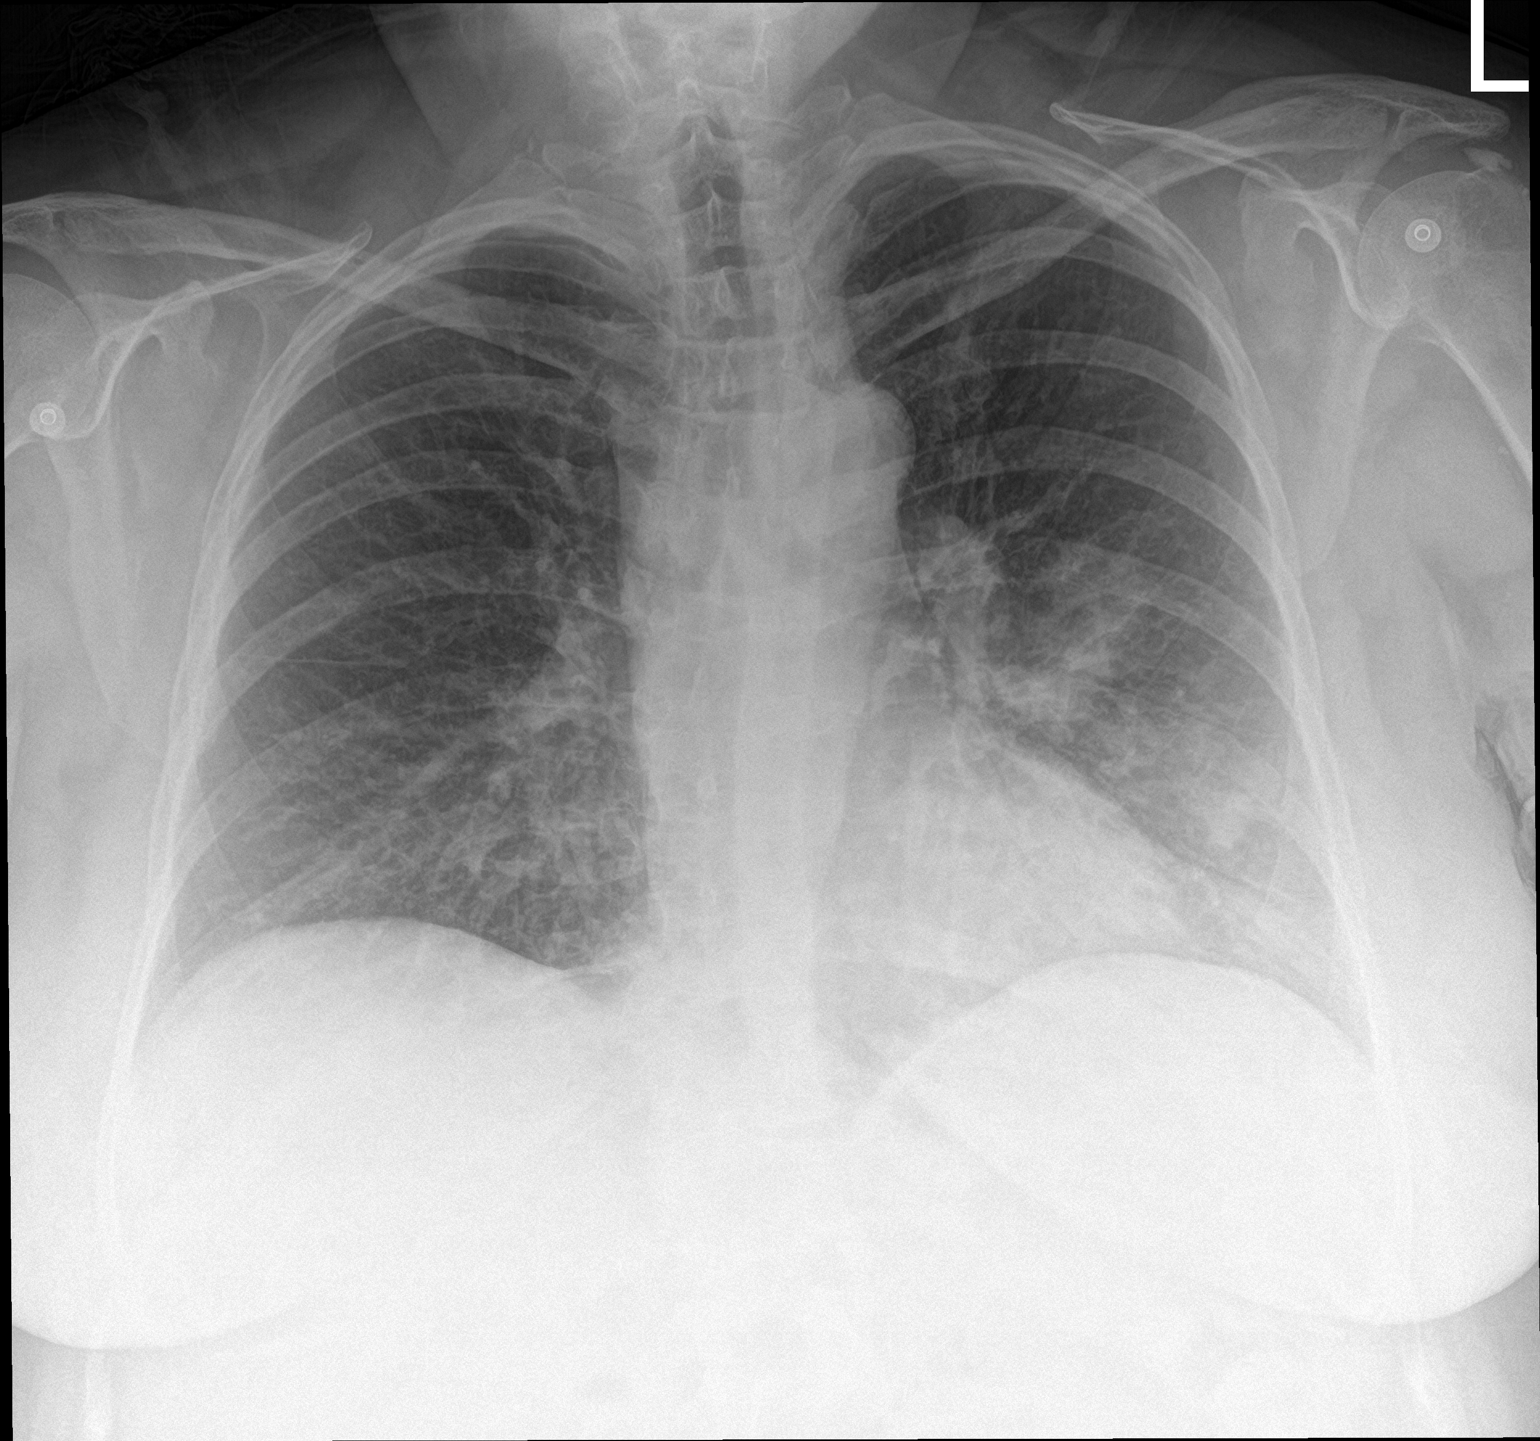

[chest lat]
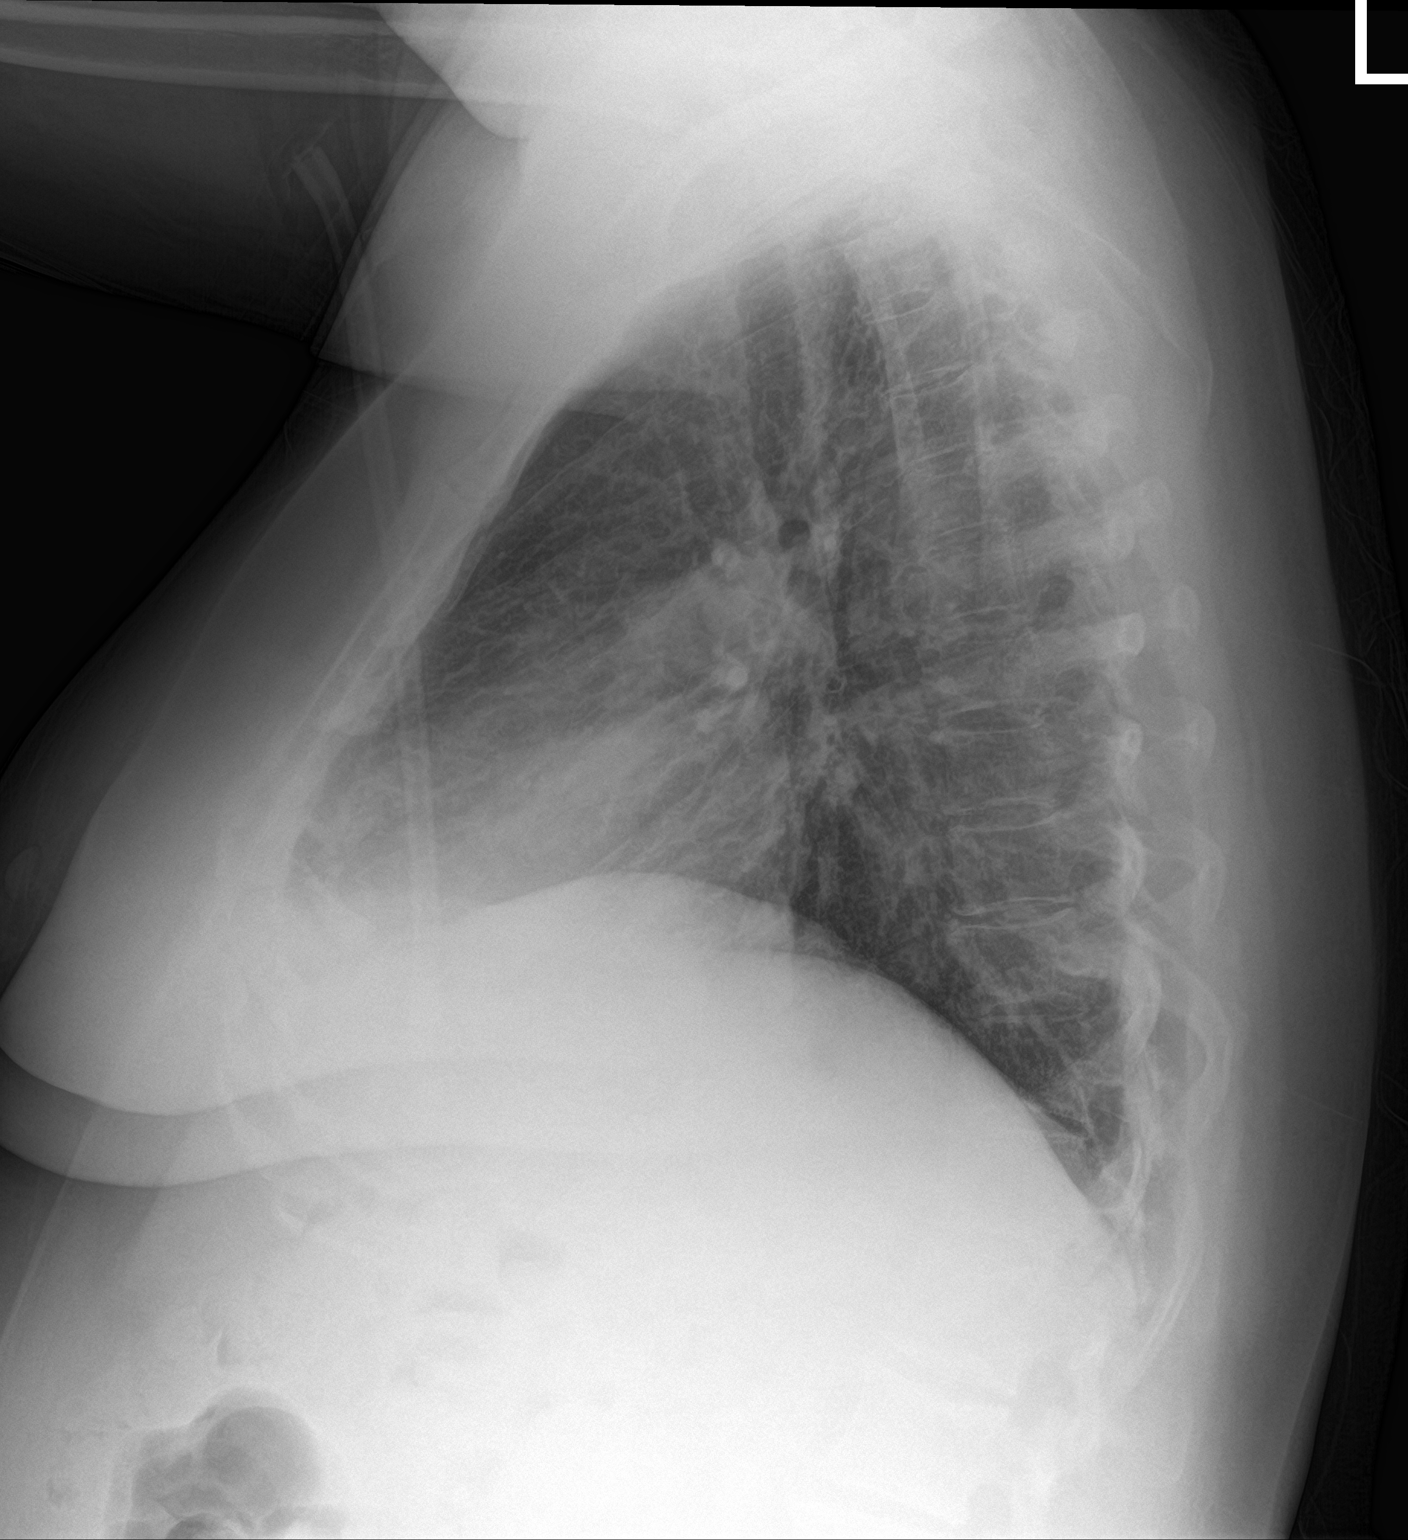

[2 of 2 positions shown; findings below may reference images not displayed]

FINDINGS: Patchy airspace disease in the left lingula likely representing
pneumonia. Right lung is clear. Heart size and pulmonary vascularity
are normal. No pleural effusions. No pneumothorax.
IMPRESSION: Patchy airspace disease in the left lingula likely representing
pneumonia.

## 2021-03-16 DIAGNOSIS — Z419 Encounter for procedure for purposes other than remedying health state, unspecified: Secondary | ICD-10-CM | POA: Diagnosis not present

## 2021-04-15 DIAGNOSIS — Z419 Encounter for procedure for purposes other than remedying health state, unspecified: Secondary | ICD-10-CM | POA: Diagnosis not present

## 2021-05-16 DIAGNOSIS — Z419 Encounter for procedure for purposes other than remedying health state, unspecified: Secondary | ICD-10-CM | POA: Diagnosis not present

## 2021-06-04 ENCOUNTER — Encounter: Payer: Self-pay | Admitting: Emergency Medicine

## 2021-06-04 ENCOUNTER — Ambulatory Visit
Admission: EM | Admit: 2021-06-04 | Discharge: 2021-06-04 | Disposition: A | Discharge status: Home / Self Care | Payer: Medicaid Other | Attending: Emergency Medicine | Admitting: Emergency Medicine

## 2021-06-04 ENCOUNTER — Other Ambulatory Visit: Payer: Self-pay

## 2021-06-04 DIAGNOSIS — J22 Unspecified acute lower respiratory infection: Secondary | ICD-10-CM

## 2021-06-04 DIAGNOSIS — H6121 Impacted cerumen, right ear: Secondary | ICD-10-CM | POA: Diagnosis not present

## 2021-06-04 MED ORDER — AZITHROMYCIN 250 MG PO TABS: 250.0000 mg | ORAL_TABLET | Freq: Every day | ORAL | 0 refills | Status: DC

## 2021-06-04 MED ORDER — BENZONATATE 100 MG PO CAPS: 200.0000 mg | ORAL_CAPSULE | Freq: Three times a day (TID) | ORAL | 0 refills | Status: DC

## 2021-06-04 MED ORDER — ALBUTEROL SULFATE HFA 108 (90 BASE) MCG/ACT IN AERS: 1.0000 | INHALATION_SPRAY | Freq: Four times a day (QID) | RESPIRATORY_TRACT | 0 refills | Status: AC | PRN

## 2021-06-04 MED ORDER — NEOMYCIN-POLYMYXIN-HC 3.5-10000-1 OT SUSP: 4.0000 [drp] | Freq: Three times a day (TID) | OTIC | 0 refills | Status: AC

## 2021-06-04 MED ORDER — DM-GUAIFENESIN ER 30-600 MG PO TB12: 1.0000 | ORAL_TABLET | Freq: Two times a day (BID) | ORAL | 0 refills | Status: AC

## 2021-06-04 NOTE — ED Triage Notes (Signed)
Pt here for cough and right ear pain after using a qtip this morning

## 2021-06-04 NOTE — Discharge Instructions (Addendum)
Use Cortisporin eardrops to right ear over the next 5 to 7 days Ensure hearing and discomfort resolving Begin azithromycin as prescribed Tessalon and Mucinex as needed for cough and congestion Albuterol inhaler 1 to 2 puffs every 4-6 hours as needed for shortness of breath chest tightness or wheezing Follow-up if any symptoms not improving or worsening

## 2021-06-04 NOTE — ED Provider Notes (Signed)
[facility]    CSN: [id number] Arrival date & time: 06/04/21  0946      History   Chief Complaint Chief Complaint  Patient presents with   Otalgia   Cough    HPI April Carson is a 55 y.o. female history of hypertension, DM type II, presenting today for evaluation of ear pain and cough.  Reports that she recently used a Q-tip and since she has had increased pain and decreased hearing to her right ear.  She also reports associated cough x2 weeks.  Reports cough was improving, but after working outside over the past couple days she has had worsening cough slight wheezing and throat discomfort/hoarseness.  Denies fevers.  HPI  Past Medical History:  Diagnosis Date   Allergy    Anxiety    Cyst of brain    Diabetes mellitus without complication (HCC)    Endometriosis    Hives    Hypertension    Recurrent UTI    history of   RLS (restless legs syndrome)     Patient Active Problem List   Diagnosis Date Noted   ETD (eustachian tube dysfunction) 01/09/2014   Vertigo 12/28/2013   Abnormal CT of brain 08/24/2013   Diabetes (HCC) 08/13/2013   HTN (hypertension) 08/13/2013   Cervical abnormality 08/13/2013   FH: melanoma 08/13/2013   Anxiety state, unspecified 08/13/2013   Reactive airway disease 08/13/2013   RLS (restless legs syndrome) 08/13/2013    Past Surgical History:  Procedure Laterality Date   CERVICAL ABLATION     COMBINED HYSTEROSCOPY DIAGNOSTIC / D&C     DILATION AND CURETTAGE OF UTERUS  1989, 1995, 1996   expoloratory laporotomy     LAPAROSCOPIC ABDOMINAL EXPLORATION     NOVASURE ABLATION      OB History   No obstetric history on file.      Home Medications    Prior to Admission medications   Medication Sig Start Date End Date Taking? Authorizing Provider  albuterol (VENTOLIN HFA) 108 (90 Base) MCG/ACT inhaler Inhale 1-2 puffs into the lungs every 6 (six) hours as needed for wheezing or shortness of breath. 06/04/21  Yes Medea Deines  C, PA-C  azithromycin (ZITHROMAX) 250 MG tablet Take 1 tablet (250 mg total) by mouth daily. Take first 2 tablets together, then 1 every day until finished. 06/04/21  Yes Lucila Klecka C, PA-C  benzonatate (TESSALON) 100 MG capsule Take 2 capsules (200 mg total) by mouth every 8 (eight) hours. 06/04/21  Yes Marisel Tostenson C, PA-C  dextromethorphan-guaiFENesin (MUCINEX DM) 30-600 MG 12hr tablet Take 1 tablet by mouth 2 (two) times daily. 06/04/21  Yes Danial Hlavac C, PA-C  neomycin-polymyxin-hydrocortisone (CORTISPORIN) 3.5-10000-1 OTIC suspension Place 4 drops into the right ear 3 (three) times daily for 5 days. 06/04/21 06/09/21 Yes Sevanna Ballengee C, PA-C  acetaminophen (TYLENOL) 500 MG tablet Take 2 tablets (1,000 mg total) by mouth every 6 (six) hours as needed. Patient taking differently: Take 1,000 mg by mouth every 6 (six) hours as needed for mild pain, moderate pain, fever or headache.  01/10/18   Arby Barrette, MD  Canagliflozin-Metformin HCl (INVOKAMET) 150-500 MG TABS Take 1 tablet by mouth daily.    [provider]  cyanocobalamin 1000 MCG tablet Take 100 mcg by mouth daily.    [provider]  fexofenadine (ALLEGRA) 180 MG tablet Take 180 mg by mouth at bedtime.     [provider]  FLUoxetine (PROZAC) 20 MG capsule Take 20 mg by mouth  daily. 09/10/16   [provider]  glucose blood test strip 1 each by Other route as needed for other. Use as instructed    [provider]  hydrochlorothiazide (HYDRODIURIL) 25 MG tablet Take 25 mg by mouth daily. 09/05/16   [provider]  ibuprofen (ADVIL,MOTRIN) 800 MG tablet Take 1 tablet (800 mg total) by mouth 3 (three) times daily. Patient taking differently: Take 800 mg by mouth every 6 (six) hours as needed for fever, headache, mild pain, moderate pain or cramping.  01/10/18   Arby BarrettePfeiffer, Marcy, MD  Multiple Vitamin (MULTIVITAMIN WITH MINERALS) TABS tablet Take 1 tablet by mouth daily. menopause  supplement    [provider]  niacin 500 MG tablet Take 500-1,000 mg by mouth 2 (two) times daily. Take 500 mg in the morning and 1000 mgs at night    [provider]  ondansetron (ZOFRAN ODT) 4 MG disintegrating tablet Take 1 tablet (4 mg total) by mouth every 8 (eight) hours as needed for nausea or vomiting. 10/05/18   Rise MuLeaphart, Kenneth T, PA-C  potassium chloride SA (K-DUR,KLOR-CON) 20 MEQ tablet Take 1 tablet (20 mEq total) by mouth daily. 02/11/18   Fayrene Helperran, Bowie, PA-C  pregabalin (LYRICA) 75 MG capsule Take 75 mg by mouth 2 (two) times daily.    [provider]  Probiotic Product (PROBIOTIC PO) Take 2 capsules by mouth daily.    [provider]  promethazine-dextromethorphan (PROMETHAZINE-DM) 6.25-15 MG/5ML syrup Take 5 ml by mouth every 6 hours prn for nause and vomiting 01/15/18   [provider]  rOPINIRole (REQUIP) 1 MG tablet TAKE 1 TABLET BY MOUTH TWICE DAILY 11/08/14   Joaquim Namuncan, Graham S, MD  sitaGLIPtin (JANUVIA) 100 MG tablet Take 100 mg by mouth daily.    [provider]    Family History Family History  Problem Relation Age of Onset   Cancer Other    Hypertension Other    Diabetes Other    Diabetes Mother    Cancer Father        melanoma   Heart attack Maternal Grandmother    Heart attack Maternal Grandfather    Cancer Paternal Grandmother    Heart attack Paternal Grandfather     Social History Social History   Tobacco Use   Smoking status: Never   Smokeless tobacco: Never  Vaping Use   Vaping Use: Never used  Substance Use Topics   Alcohol use: Yes    Comment: rarely   Drug use: No     Allergies   Multihance [gadobenate] and Aloe   Review of Systems Review of Systems  Constitutional:  Negative for activity change, appetite change, chills, fatigue and fever.  HENT:  Positive for congestion, ear pain, hearing loss and sore throat. Negative for rhinorrhea, sinus pressure and trouble swallowing.   Eyes:   Negative for discharge and redness.  Respiratory:  Positive for cough. Negative for chest tightness and shortness of breath.   Cardiovascular:  Negative for chest pain.  Gastrointestinal:  Negative for abdominal pain, diarrhea, nausea and vomiting.  Musculoskeletal:  Negative for myalgias.  Skin:  Negative for rash.  Neurological:  Negative for dizziness, light-headedness and headaches.    Physical Exam Triage Vital Signs ED Triage Vitals  Enc Vitals Group     BP      Pulse      Resp      Temp      Temp src      SpO2  Weight      Height      Head Circumference      Peak Flow      Pain Score      Pain Loc      Pain Edu?      Excl. in GC?    No data found.  Updated Vital Signs BP 119/61 (BP Location: Right Arm)   Pulse 80   Temp 97.7 F (36.5 C) (Oral)   Resp 18   SpO2 97%   Visual Acuity Right Eye Distance:   Left Eye Distance:   Bilateral Distance:    Right Eye Near:   Left Eye Near:    Bilateral Near:     Physical Exam Vitals and nursing note reviewed.  Constitutional:      Appearance: She is well-developed.     Comments: No acute distress  HENT:     Head: Normocephalic and atraumatic.     Ears:     Comments: Right canal with minimal tragus tenderness, cerumen impaction blocking initial visualization of TM, after irrigation, TM visualized with good bony landmarks and cone of light, no erythema      Nose: Nose normal.     Mouth/Throat:     Comments: Oral mucosa pink and moist, no tonsillar enlargement or exudate. Posterior pharynx patent and nonerythematous, no uvula deviation or swelling. Normal phonation.  Eyes:     Conjunctiva/sclera: Conjunctivae normal.  Cardiovascular:     Rate and Rhythm: Normal rate and regular rhythm.  Pulmonary:     Effort: Pulmonary effort is normal. No respiratory distress.     Comments: Breathing comfortably at rest, CTABL, no wheezing, rales or other adventitious sounds auscultated  Abdominal:     General: There  is no distension.  Musculoskeletal:        General: Normal range of motion.     Cervical back: Neck supple.  Skin:    General: Skin is warm and dry.  Neurological:     Mental Status: She is alert and oriented to person, place, and time.     UC Treatments / Results  Labs (all labs ordered are listed, but only abnormal results are displayed) Labs Reviewed - No data to display  EKG   Radiology No results found.  Procedures Procedures (including critical care time)  Medications Ordered in UC Medications - No data to display  Initial Impression / Assessment and Plan / UC Course  I have reviewed the triage vital signs and the nursing notes.  Pertinent labs & imaging results that were available during my care of the patient were reviewed by me and considered in my medical decision making (see chart for details).     Right cerumen impaction-irrigation by nursing staff with complete resolution of impaction, is improvement in hearing, given slight discomfort as well as recent use of Q-tip we will go ahead and cover for mild otitis externa with Cortisporin Cough/URI symptoms x2 weeks-covering with azithromycin, Tessalon and Mucinex for cough, albuterol inhaler as needed for any wheezing, possible early bronchitis, rest and fluids  Discussed strict return precautions. Patient verbalized understanding and is agreeable with plan.   Final Clinical Impressions(s) / UC Diagnoses   Final diagnoses:  Impacted cerumen of right ear  Lower respiratory infection (e.g., bronchitis, pneumonia, pneumonitis, pulmonitis)     Discharge Instructions      Use Cortisporin eardrops to right ear over the next 5 to 7 days Ensure hearing and discomfort resolving Begin azithromycin as prescribed Tessalon and Mucinex  as needed for cough and congestion Albuterol inhaler 1 to 2 puffs every 4-6 hours as needed for shortness of breath chest tightness or wheezing Follow-up if any symptoms not improving  or worsening     ED Prescriptions     Medication Sig Dispense Auth. Provider   neomycin-polymyxin-hydrocortisone (CORTISPORIN) 3.5-10000-1 OTIC suspension Place 4 drops into the right ear 3 (three) times daily for 5 days. 10 mL Sherika Kubicki C, PA-C   azithromycin (ZITHROMAX) 250 MG tablet Take 1 tablet (250 mg total) by mouth daily. Take first 2 tablets together, then 1 every day until finished. 6 tablet Ritvik Mczeal C, PA-C   benzonatate (TESSALON) 100 MG capsule Take 2 capsules (200 mg total) by mouth every 8 (eight) hours. 30 capsule Bilal Manzer C, PA-C   dextromethorphan-guaiFENesin (MUCINEX DM) 30-600 MG 12hr tablet Take 1 tablet by mouth 2 (two) times daily. 20 tablet Myisha Pickerel C, PA-C   albuterol (VENTOLIN HFA) 108 (90 Base) MCG/ACT inhaler Inhale 1-2 puffs into the lungs every 6 (six) hours as needed for wheezing or shortness of breath. 1 each Hayk Divis, Junius Creamer, PA-C      PDMP not reviewed this encounter.   Selmer Adduci, Pyote C, PA-C 06/04/21 1150

## 2021-06-15 DIAGNOSIS — Z419 Encounter for procedure for purposes other than remedying health state, unspecified: Secondary | ICD-10-CM | POA: Diagnosis not present

## 2021-07-16 DIAGNOSIS — Z419 Encounter for procedure for purposes other than remedying health state, unspecified: Secondary | ICD-10-CM | POA: Diagnosis not present

## 2021-08-13 ENCOUNTER — Other Ambulatory Visit: Payer: Self-pay

## 2021-08-13 ENCOUNTER — Ambulatory Visit
Admission: EM | Admit: 2021-08-13 | Discharge: 2021-08-13 | Disposition: A | Discharge status: Other Acute Inpatient Hospital | Payer: Medicaid Other | Attending: Nurse Practitioner | Admitting: Nurse Practitioner

## 2021-08-13 ENCOUNTER — Emergency Department (HOSPITAL_BASED_OUTPATIENT_CLINIC_OR_DEPARTMENT_OTHER): Payer: Medicaid Other | Admitting: Radiology

## 2021-08-13 ENCOUNTER — Encounter: Payer: Self-pay | Admitting: Emergency Medicine

## 2021-08-13 ENCOUNTER — Emergency Department (HOSPITAL_BASED_OUTPATIENT_CLINIC_OR_DEPARTMENT_OTHER)
Admission: EM | Admit: 2021-08-13 | Discharge: 2021-08-14 | Disposition: A | Discharge status: Home / Self Care | Payer: Medicaid Other | Attending: Emergency Medicine | Admitting: Emergency Medicine

## 2021-08-13 DIAGNOSIS — R059 Cough, unspecified: Secondary | ICD-10-CM | POA: Diagnosis not present

## 2021-08-13 DIAGNOSIS — Z79899 Other long term (current) drug therapy: Secondary | ICD-10-CM | POA: Diagnosis not present

## 2021-08-13 DIAGNOSIS — R0902 Hypoxemia: Secondary | ICD-10-CM

## 2021-08-13 DIAGNOSIS — J45909 Unspecified asthma, uncomplicated: Secondary | ICD-10-CM | POA: Insufficient documentation

## 2021-08-13 DIAGNOSIS — E119 Type 2 diabetes mellitus without complications: Secondary | ICD-10-CM | POA: Insufficient documentation

## 2021-08-13 DIAGNOSIS — R112 Nausea with vomiting, unspecified: Secondary | ICD-10-CM

## 2021-08-13 DIAGNOSIS — Z20822 Contact with and (suspected) exposure to covid-19: Secondary | ICD-10-CM | POA: Insufficient documentation

## 2021-08-13 DIAGNOSIS — I10 Essential (primary) hypertension: Secondary | ICD-10-CM | POA: Diagnosis not present

## 2021-08-13 DIAGNOSIS — Z7984 Long term (current) use of oral hypoglycemic drugs: Secondary | ICD-10-CM | POA: Diagnosis not present

## 2021-08-13 DIAGNOSIS — R509 Fever, unspecified: Secondary | ICD-10-CM

## 2021-08-13 DIAGNOSIS — J00 Acute nasopharyngitis [common cold]: Secondary | ICD-10-CM | POA: Diagnosis not present

## 2021-08-13 DIAGNOSIS — J029 Acute pharyngitis, unspecified: Secondary | ICD-10-CM | POA: Diagnosis not present

## 2021-08-13 LAB — RESP PANEL BY RT-PCR (FLU A&B, COVID) ARPGX2
Influenza A by PCR: NEGATIVE
Influenza B by PCR: NEGATIVE
SARS Coronavirus 2 by RT PCR: NEGATIVE

## 2021-08-13 MED ORDER — BENZONATATE 100 MG PO CAPS: 200.0000 mg | ORAL_CAPSULE | Freq: Three times a day (TID) | ORAL | 0 refills | Status: AC

## 2021-08-13 MED ORDER — ACETAMINOPHEN 500 MG PO TABS
1000.0000 mg | ORAL_TABLET | Freq: Once | ORAL | Status: AC
  Administered 2021-08-13: 1000 mg via ORAL

## 2021-08-13 MED ORDER — AZITHROMYCIN 250 MG PO TABS: 250.0000 mg | ORAL_TABLET | Freq: Every day | ORAL | 0 refills | Status: AC

## 2021-08-13 MED ORDER — ONDANSETRON 8 MG PO TBDP
8.0000 mg | ORAL_TABLET | Freq: Once | ORAL | Status: AC
  Administered 2021-08-13: 8 mg via ORAL

## 2021-08-13 NOTE — ED Triage Notes (Signed)
Children were recently sick. Two days ago began getting a fever, sore throat. Has worsened into today. Presents with fever, nausea, productive cough, vomiting in triage. C/o headache, generalized body aches, and neck pain. HR 110, O2 89%, T: 101.1.

## 2021-08-13 NOTE — Discharge Instructions (Addendum)
Go to the ER now!!!  You will need to be tested for COVID and receive treatment which may include oxygen therapy.  It is ok for your husband to drive you.

## 2021-08-13 NOTE — ED Triage Notes (Signed)
Pt arrives POV, c/o sore throat since Thursday/Friday with cough starting over the weekend.  Was seen at [facility] today, and was sent to ED due to O2 sats 89-91% on RA and fever of 100.4.

## 2021-08-13 NOTE — ED Provider Notes (Signed)
[facility] EMERGENCY DEPT Provider Note  CSN: [id number] Arrival date & time: 08/13/21 1738  Chief Complaint(s) Cough  HPI April Carson is a 55 y.o. female   The history is provided by the patient.  Cough Cough characteristics:  Productive Sputum characteristics:  Yellow Severity:  Moderate Onset quality:  Gradual Duration:  5 days Timing:  Constant Progression:  Waxing and waning Chronicity:  New Context: upper respiratory infection   Associated symptoms: chest pain (tightness with coughing), chills, fever, myalgias, rhinorrhea, sinus congestion and sore throat    Past Medical History Past Medical History:  Diagnosis Date   Allergy    Anxiety    Cyst of brain    Diabetes mellitus without complication (HCC)    Endometriosis    Hives    Hypertension    Recurrent UTI    history of   RLS (restless legs syndrome)    Patient Active Problem List   Diagnosis Date Noted   ETD (eustachian tube dysfunction) 01/09/2014   Vertigo 12/28/2013   Abnormal CT of brain 08/24/2013   Diabetes (HCC) 08/13/2013   HTN (hypertension) 08/13/2013   Cervical abnormality 08/13/2013   FH: melanoma 08/13/2013   Anxiety state, unspecified 08/13/2013   Reactive airway disease 08/13/2013   RLS (restless legs syndrome) 08/13/2013   Home Medication(s) Prior to Admission medications   Medication Sig Start Date End Date Taking? Authorizing Provider  acetaminophen (TYLENOL) 500 MG tablet Take 2 tablets (1,000 mg total) by mouth every 6 (six) hours as needed. Patient taking differently: Take 1,000 mg by mouth every 6 (six) hours as needed for mild pain, moderate pain, fever or headache.  01/10/18   Arby Barrette, MD  albuterol (VENTOLIN HFA) 108 (90 Base) MCG/ACT inhaler Inhale 1-2 puffs into the lungs every 6 (six) hours as needed for wheezing or shortness of breath. 06/04/21   Wieters, Hallie C, PA-C  azithromycin (ZITHROMAX) 250 MG tablet Take 1 tablet (250 mg total) by mouth  daily. Take first 2 tablets together, then 1 every day until finished. 08/13/21   Nira Conn, MD  benzonatate (TESSALON) 100 MG capsule Take 2 capsules (200 mg total) by mouth every 8 (eight) hours. 08/13/21   Nira Conn, MD  Canagliflozin-Metformin HCl (INVOKAMET) 150-500 MG TABS Take 1 tablet by mouth daily.    [provider]  cyanocobalamin 1000 MCG tablet Take 100 mcg by mouth daily.    [provider]  dextromethorphan-guaiFENesin (MUCINEX DM) 30-600 MG 12hr tablet Take 1 tablet by mouth 2 (two) times daily. 06/04/21   Wieters, Hallie C, PA-C  fexofenadine (ALLEGRA) 180 MG tablet Take 180 mg by mouth at bedtime.     [provider]  FLUoxetine (PROZAC) 20 MG capsule Take 20 mg by mouth daily. 09/10/16   [provider]  glucose blood test strip 1 each by Other route as needed for other. Use as instructed    [provider]  hydrochlorothiazide (HYDRODIURIL) 25 MG tablet Take 25 mg by mouth daily. 09/05/16   [provider]  ibuprofen (ADVIL,MOTRIN) 800 MG tablet Take 1 tablet (800 mg total) by mouth 3 (three) times daily. Patient taking differently: Take 800 mg by mouth every 6 (six) hours as needed for fever, headache, mild pain, moderate pain or cramping.  01/10/18   Arby Barrette, MD  Multiple Vitamin (MULTIVITAMIN WITH MINERALS) TABS tablet Take 1 tablet by mouth daily. menopause supplement    [provider]  niacin 500 MG tablet Take 500-1,000 mg by  mouth 2 (two) times daily. Take 500 mg in the morning and 1000 mgs at night    [provider]  ondansetron (ZOFRAN ODT) 4 MG disintegrating tablet Take 1 tablet (4 mg total) by mouth every 8 (eight) hours as needed for nausea or vomiting. 10/05/18   Rise Mu, PA-C  potassium chloride SA (K-DUR,KLOR-CON) 20 MEQ tablet Take 1 tablet (20 mEq total) by mouth daily. 02/11/18   Fayrene Helper, PA-C  pregabalin (LYRICA) 75 MG capsule Take 75 mg by mouth  2 (two) times daily.    [provider]  Probiotic Product (PROBIOTIC PO) Take 2 capsules by mouth daily.    [provider]  promethazine-dextromethorphan (PROMETHAZINE-DM) 6.25-15 MG/5ML syrup Take 5 ml by mouth every 6 hours prn for nause and vomiting 01/15/18   [provider]  rOPINIRole (REQUIP) 1 MG tablet TAKE 1 TABLET BY MOUTH TWICE DAILY 11/08/14   Joaquim Nam, MD  sitaGLIPtin (JANUVIA) 100 MG tablet Take 100 mg by mouth daily.    [provider]                                                                                                                                    Past Surgical History Past Surgical History:  Procedure Laterality Date   CERVICAL ABLATION     COMBINED HYSTEROSCOPY DIAGNOSTIC / D&C     DILATION AND CURETTAGE OF UTERUS  1989, 1995, 1996   expoloratory laporotomy     LAPAROSCOPIC ABDOMINAL EXPLORATION     NOVASURE ABLATION     Family History Family History  Problem Relation Age of Onset   Cancer Other    Hypertension Other    Diabetes Other    Diabetes Mother    Cancer Father        melanoma   Heart attack Maternal Grandmother    Heart attack Maternal Grandfather    Cancer Paternal Grandmother    Heart attack Paternal Grandfather     Social History Social History   Tobacco Use   Smoking status: Never   Smokeless tobacco: Never  Vaping Use   Vaping Use: Never used  Substance Use Topics   Alcohol use: Yes    Comment: rarely   Drug use: No   Allergies Multihance [gadobenate] and Aloe  Review of Systems Review of Systems  Constitutional:  Positive for chills and fever.  HENT:  Positive for rhinorrhea and sore throat.   Respiratory:  Positive for cough.   Cardiovascular:  Positive for chest pain (tightness with coughing).  Musculoskeletal:  Positive for myalgias.  All other systems are reviewed and are negative for acute change except as noted in the HPI  Physical Exam Vital Signs  I have  reviewed the triage vital signs BP 137/61   Pulse 76   Temp 98.2 F (36.8 C) (Oral)   Resp 18   Ht 5\' 4"  (1.626 m)  Wt 108.9 kg   LMP 09/15/2018   SpO2 96%   BMI 41.21 kg/m   Physical Exam Vitals reviewed.  Constitutional:      General: She is not in acute distress.    Appearance: She is well-developed. She is not diaphoretic.  HENT:     Head: Normocephalic and atraumatic.     Nose: Congestion and rhinorrhea present.     Right Sinus: Maxillary sinus tenderness present.     Left Sinus: Maxillary sinus tenderness present.  Eyes:     General: No scleral icterus.       Right eye: No discharge.        Left eye: No discharge.     Conjunctiva/sclera: Conjunctivae normal.     Pupils: Pupils are equal, round, and reactive to light.  Cardiovascular:     Rate and Rhythm: Normal rate and regular rhythm.     Heart sounds: No murmur heard.   No friction rub. No gallop.  Pulmonary:     Effort: Pulmonary effort is normal. No respiratory distress.     Breath sounds: Normal breath sounds. No stridor. No rales.  Abdominal:     General: There is no distension.     Palpations: Abdomen is soft.     Tenderness: There is no abdominal tenderness.  Musculoskeletal:        General: No tenderness.     Cervical back: Normal range of motion and neck supple.  Skin:    General: Skin is warm and dry.     Findings: No erythema or rash.  Neurological:     Mental Status: She is alert and oriented to person, place, and time.    ED Results and Treatments Labs (all labs ordered are listed, but only abnormal results are displayed) Labs Reviewed  RESP PANEL BY RT-PCR (FLU A&B, COVID) [id number]                                                                                                                         EKG  EKG Interpretation  Date/Time:    Ventricular Rate:    PR Interval:    QRS Duration:   QT Interval:    QTC Calculation:   R Axis:     Text Interpretation:          Radiology DG Chest 2 View  Result Date: 08/13/2021 CLINICAL DATA:  Sore throat and cough. EXAM: CHEST - 2 VIEW COMPARISON:  March 20, 2020 FINDINGS: Decreased lung volumes are seen with mildly increased suprahilar and infrahilar lung markings. There is no evidence of an acute infiltrate, pleural effusion or pneumothorax. The heart size and mediastinal contours are within normal limits. The visualized skeletal structures are unremarkable. IMPRESSION: Mild bilateral suprahilar and infrahilar atelectatic changes without acute infiltrate. Electronically Signed   By: Aram Candela M.D.   On: 08/13/2021 19:09    Pertinent labs & imaging results that were available during my care of the patient were reviewed by me and  considered in my medical decision making (see MDM for details).  Medications Ordered in ED Medications - No data to display                                                                                                                                   Procedures Procedures  (including critical care time)  Medical Decision Making / ED Course I have reviewed the nursing notes for this encounter and the patient's prior records (if available in EHR or on provided paperwork).  Ardeen Fillerseressa Kokesh was evaluated in Emergency Department on 08/13/2021 for the symptoms described in the history of present illness. She was evaluated in the context of the global COVID-19 pandemic, which necessitated consideration that the patient might be at risk for infection with the SARS-CoV-2 virus that causes COVID-19. Institutional protocols and algorithms that pertain to the evaluation of patients at risk for COVID-19 are in a state of rapid change based on information released by regulatory bodies including the CDC and federal and state organizations. These policies and algorithms were followed during the patient's care in the ED.     Patient presents with viral symptoms for 5 days. Adequate oral hydration.  Rest of history as above.  Patient appears well. No signs of toxicity, patient is interactive. No hypoxia, tachypnea or other signs of respiratory distress. No sign of clinical dehydration. Lung exam clear. Rest of exam as above.  COVID/Flu negative. CXR negative for PNA.  Most consistent with viral illness   No evidence suggestive of pharyngitis, AOM, PNA, or meningitis.  Satting well in RA.  Discussed symptomatic treatment with the patient and they will follow closely with their PCP. Will Rx Z-pak in case she doesn't improve in 5-7 days.  Pertinent labs & imaging results that were available during my care of the patient were reviewed by me and considered in my medical decision making:    Final Clinical Impression(s) / ED Diagnoses Final diagnoses:  Acute nasopharyngitis   The patient appears reasonably screened and/or stabilized for discharge and I doubt any other medical condition or other [facility] requiring further screening, evaluation, or treatment in the ED at this time prior to discharge. Safe for discharge with strict return precautions.  Disposition: Discharge  Condition: Good  I have discussed the results, Dx and Tx plan with the patient/family who expressed understanding and agree(s) with the plan. Discharge instructions discussed at length. The patient/family was given strict return precautions who verbalized understanding of the instructions. No further questions at time of discharge.    ED Discharge Orders          Ordered    azithromycin (ZITHROMAX) 250 MG tablet  Daily        08/13/21 2330    benzonatate (TESSALON) 100 MG capsule  Every 8 hours        08/13/21 2330             Follow  Up: Primary care provider  Call  if you do not have a primary care physician, contact [facility] at [phone] for referral     This chart was dictated using voice recognition software.  Despite best efforts to proofread,  errors can occur which can change the  documentation meaning.    Nira Conn, MD 08/13/21 [phone]

## 2021-08-13 NOTE — ED Provider Notes (Signed)
[facility]    CSN: [id number] Arrival date & time: 08/13/21  1446      History   Chief Complaint Chief Complaint  Patient presents with   Fever   Nausea   Cough    HPI Darrelle Barrell is a 55 y.o. female.   Subjective:   Ladawna Walgren is a 55 y.o. female who presents for evaluation of COVID-like symptoms. Symptoms include suspected fevers, chest congestion, chest pain during cough, chills, headache, hoarseness, myalgias, productive cough, shortness of breath, nasal congestion, nausea, dry heaves, vomiting, fatigue and sore throat. She denies any runny nose, wheezing, or diarrhea. Symptoms have been present for the past 3-4 days. She has tried to alleviate the symptoms with acetaminophen, rest, and fluids with minimal to no relief. Patient went to a concert about 9 days ago. She later found out that there were people there in attendance that has tested positive for COVID. Her daughter also went to the concert and is currently sick as well.  Her daughter was evaluated couple of days ago and results of her COVID test is still pending.  Patient reports a prior history of COVID back in December 2020 in which she was hospitalized at [facility] and treated with remdesivir and decadron. She denies requiring any vent support but wasn't discharged until 03/18/2020. The patient has not been vaccinated against COVID.   The following portions of the patient's history were reviewed and updated as appropriate: allergies, current medications, past family history, past medical history, past social history, past surgical history, and problem list.      Past Medical History:  Diagnosis Date   Allergy    Anxiety    Cyst of brain    Diabetes mellitus without complication (HCC)    Endometriosis    Hives    Hypertension    Recurrent UTI    history of   RLS (restless legs syndrome)     Patient Active Problem List   Diagnosis Date Noted   ETD (eustachian tube dysfunction)  01/09/2014   Vertigo 12/28/2013   Abnormal CT of brain 08/24/2013   Diabetes (HCC) 08/13/2013   HTN (hypertension) 08/13/2013   Cervical abnormality 08/13/2013   FH: melanoma 08/13/2013   Anxiety state, unspecified 08/13/2013   Reactive airway disease 08/13/2013   RLS (restless legs syndrome) 08/13/2013    Past Surgical History:  Procedure Laterality Date   CERVICAL ABLATION     COMBINED HYSTEROSCOPY DIAGNOSTIC / D&C     DILATION AND CURETTAGE OF UTERUS  1989, 1995, 1996   expoloratory laporotomy     LAPAROSCOPIC ABDOMINAL EXPLORATION     NOVASURE ABLATION      OB History   No obstetric history on file.      Home Medications    Prior to Admission medications   Medication Sig Start Date End Date Taking? Authorizing Provider  acetaminophen (TYLENOL) 500 MG tablet Take 2 tablets (1,000 mg total) by mouth every 6 (six) hours as needed. Patient taking differently: Take 1,000 mg by mouth every 6 (six) hours as needed for mild pain, moderate pain, fever or headache.  01/10/18   Arby Barrette, MD  albuterol (VENTOLIN HFA) 108 (90 Base) MCG/ACT inhaler Inhale 1-2 puffs into the lungs every 6 (six) hours as needed for wheezing or shortness of breath. 06/04/21   Wieters, Hallie C, PA-C  azithromycin (ZITHROMAX) 250 MG tablet Take 1 tablet (250 mg total) by mouth daily. Take first 2 tablets together, then 1 every day until  finished. 06/04/21   Wieters, Hallie C, PA-C  benzonatate (TESSALON) 100 MG capsule Take 2 capsules (200 mg total) by mouth every 8 (eight) hours. 06/04/21   Wieters, Hallie C, PA-C  Canagliflozin-Metformin HCl (INVOKAMET) 150-500 MG TABS Take 1 tablet by mouth daily.    [provider]  cyanocobalamin 1000 MCG tablet Take 100 mcg by mouth daily.    [provider]  dextromethorphan-guaiFENesin (MUCINEX DM) 30-600 MG 12hr tablet Take 1 tablet by mouth 2 (two) times daily. 06/04/21   Wieters, Hallie C, PA-C  fexofenadine (ALLEGRA) 180 MG tablet Take 180 mg  by mouth at bedtime.     [provider]  FLUoxetine (PROZAC) 20 MG capsule Take 20 mg by mouth daily. 09/10/16   [provider]  glucose blood test strip 1 each by Other route as needed for other. Use as instructed    [provider]  hydrochlorothiazide (HYDRODIURIL) 25 MG tablet Take 25 mg by mouth daily. 09/05/16   [provider]  ibuprofen (ADVIL,MOTRIN) 800 MG tablet Take 1 tablet (800 mg total) by mouth 3 (three) times daily. Patient taking differently: Take 800 mg by mouth every 6 (six) hours as needed for fever, headache, mild pain, moderate pain or cramping.  01/10/18   Arby Barrette, MD  Multiple Vitamin (MULTIVITAMIN WITH MINERALS) TABS tablet Take 1 tablet by mouth daily. menopause supplement    [provider]  niacin 500 MG tablet Take 500-1,000 mg by mouth 2 (two) times daily. Take 500 mg in the morning and 1000 mgs at night    [provider]  ondansetron (ZOFRAN ODT) 4 MG disintegrating tablet Take 1 tablet (4 mg total) by mouth every 8 (eight) hours as needed for nausea or vomiting. 10/05/18   Rise Mu, PA-C  potassium chloride SA (K-DUR,KLOR-CON) 20 MEQ tablet Take 1 tablet (20 mEq total) by mouth daily. 02/11/18   Fayrene Helper, PA-C  pregabalin (LYRICA) 75 MG capsule Take 75 mg by mouth 2 (two) times daily.    [provider]  Probiotic Product (PROBIOTIC PO) Take 2 capsules by mouth daily.    [provider]  promethazine-dextromethorphan (PROMETHAZINE-DM) 6.25-15 MG/5ML syrup Take 5 ml by mouth every 6 hours prn for nause and vomiting 01/15/18   [provider]  rOPINIRole (REQUIP) 1 MG tablet TAKE 1 TABLET BY MOUTH TWICE DAILY 11/08/14   Joaquim Nam, MD  sitaGLIPtin (JANUVIA) 100 MG tablet Take 100 mg by mouth daily.    [provider]    Family History Family History  Problem Relation Age of Onset   Cancer Other    Hypertension Other    Diabetes Other    Diabetes  Mother    Cancer Father        melanoma   Heart attack Maternal Grandmother    Heart attack Maternal Grandfather    Cancer Paternal Grandmother    Heart attack Paternal Grandfather     Social History Social History   Tobacco Use   Smoking status: Never   Smokeless tobacco: Never  Vaping Use   Vaping Use: Never used  Substance Use Topics   Alcohol use: Yes    Comment: rarely   Drug use: No     Allergies   Multihance [gadobenate] and Aloe   Review of Systems Review of Systems  Constitutional:  Positive for fatigue and fever.  HENT:  Positive for congestion and sore throat.   Respiratory:  Positive for cough and shortness of breath.  Gastrointestinal:  Positive for nausea and vomiting. Negative for diarrhea.  Musculoskeletal:  Positive for myalgias.  Neurological:  Positive for headaches.  All other systems reviewed and are negative.   Physical Exam Triage Vital Signs ED Triage Vitals  Enc Vitals Group     BP      Pulse      Resp      Temp      Temp src      SpO2      Weight      Height      Head Circumference      Peak Flow      Pain Score      Pain Loc      Pain Edu?      Excl. in GC?    No data found.  Updated Vital Signs BP (!) 120/59 (BP Location: Left Arm)   Pulse 99   Temp (!) 100.4 F (38 C) (Oral)   Resp 18   LMP 09/15/2018   SpO2 91%   Visual Acuity Right Eye Distance:   Left Eye Distance:   Bilateral Distance:    Right Eye Near:   Left Eye Near:    Bilateral Near:     Physical Exam Vitals and nursing note reviewed.  Constitutional:      General: She is not in acute distress.    Appearance: Normal appearance. She is ill-appearing. She is not toxic-appearing or diaphoretic.  HENT:     Head: Normocephalic.     Mouth/Throat:     Pharynx: Posterior oropharyngeal erythema present.  Cardiovascular:     Rate and Rhythm: Normal rate.  Pulmonary:     Effort: Pulmonary effort is normal.     Breath sounds: Normal breath sounds.   Musculoskeletal:        General: Normal range of motion.     Cervical back: Normal range of motion and neck supple. No rigidity.  Lymphadenopathy:     Cervical: No cervical adenopathy.  Skin:    General: Skin is warm and dry.  Neurological:     General: No focal deficit present.     Mental Status: She is alert and oriented to person, place, and time.  Psychiatric:        Mood and Affect: Mood normal.        Behavior: Behavior normal.     UC Treatments / Results  Labs (all labs ordered are listed, but only abnormal results are displayed) Labs Reviewed - No data to display  EKG   Radiology No results found.  Procedures Procedures (including critical care time)  Medications Ordered in UC Medications  ondansetron (ZOFRAN-ODT) disintegrating tablet 8 mg (8 mg Oral Given 08/13/21 1537)  acetaminophen (TYLENOL) tablet 1,000 mg (1,000 mg Oral Given 08/13/21 1538)    Initial Impression / Assessment and Plan / UC Course  I have reviewed the triage vital signs and the nursing notes.  Pertinent labs & imaging results that were available during my care of the patient were reviewed by me and considered in my medical decision making (see chart for details).     55 yo female presenting with a 3 to 4-day history of worsening subjective fevers, congestion, chills, headache, myalgias, cough, shortness of breath, nausea and fatigue.  She has known COVID exposure as well as sick contacts in the home.  She has a history of COVID back in December 2020 in which she was hospitalized for close to 3 months but did not require  any ventilatory support.  The patient is not vaccinated against COVID-19.    EMS was called by her family earlier today. Patient states that her family was worried that she would "get bad" like she did when she had COVID previously. After being evaluated, patient report that the EMS told her that [facility] was on diversion so she wasn't transported anywhere. She was told by  EMS to come to urgent care for evaluation.   The patient is alert and oriented x3.  Febrile at 100.4.  Ill-appearing but nontoxic.  Oxygen saturations between 89 to 92% on room air. Highly suspect acute COVID infection with pending hypoxic respiratory failure.  Advised patient to report to the ER immediately for further evaluation and treatment.  Patient is to travel by private vehicle.  Her husband will drive her.  He was given Tylenol 1000 mg p.o. and 8 mg of Zofran here in the clinic.  Patient and/or guardian comfortable with plan and disposition.  Patient and/or guardian has a clear mental status at this time, good insight into illness (after discussion and teaching) and has clear judgment to make decisions regarding their care  This care was provided during an unprecedented National Emergency due to the Novel Coronavirus (COVID-19) pandemic. COVID-19 infections and transmission risks place heavy strains on healthcare resources.  As this pandemic evolves, our facility, providers, and staff strive to respond fluidly, to remain operational, and to provide care relative to available resources and information. Outcomes are unpredictable and treatments are without well-defined guidelines. Further, the impact of COVID-19 on all aspects of urgent care, including the impact to patients seeking care for reasons other than COVID-19, is unavoidable during this national emergency. At this time of the global pandemic, management of patients has significantly changed, even for non-COVID positive patients given high local and regional COVID volumes at this time requiring high healthcare system and resource utilization. The standard of care for management of both COVID suspected and non-COVID suspected patients continues to change rapidly at the local, regional, national, and global levels. This patient was worked up and treated to the best available but ever changing evidence and resources available at this current time.    Documentation was completed with the aid of voice recognition software. Transcription may contain typographical errors. Final Clinical Impressions(s) / UC Diagnoses   Final diagnoses:  Close exposure to COVID-19 virus  Fever, unspecified  Intractable vomiting with nausea, unspecified vomiting type  Hypoxemia     Discharge Instructions      Go to the ER now!!!  You will need to be tested for COVID and receive treatment which may include oxygen therapy.  It is ok for your husband to drive you.      ED Prescriptions   None    PDMP not reviewed this encounter.   Lurline Idol, [place] 08/13/21 [phone]

## 2021-08-13 NOTE — Discharge Instructions (Addendum)
You may take over-the-counter medicine for symptomatic relief, such as Tylenol, Motrin, TheraFlu, Alka seltzer , black elderberry, etc. Please limit acetaminophen (Tylenol) to 4000 mg and Ibuprofen (Motrin, Advil, etc.) to 2400 mg for a 24hr period. Please note that other over-the-counter medicine may contain acetaminophen or ibuprofen as a component of their ingredients.   

## 2021-08-16 DIAGNOSIS — Z419 Encounter for procedure for purposes other than remedying health state, unspecified: Secondary | ICD-10-CM | POA: Diagnosis not present

## 2021-08-18 ENCOUNTER — Ambulatory Visit
Admission: RE | Admit: 2021-08-18 | Discharge: 2021-08-18 | Disposition: A | Discharge status: Home / Self Care | Payer: Medicaid Other | Source: Ambulatory Visit | Attending: Urgent Care | Admitting: Urgent Care

## 2021-08-18 ENCOUNTER — Other Ambulatory Visit: Payer: Self-pay

## 2021-08-18 ENCOUNTER — Ambulatory Visit (INDEPENDENT_AMBULATORY_CARE_PROVIDER_SITE_OTHER): Payer: Medicaid Other

## 2021-08-18 VITALS — BP 130/70 | HR 76 | Temp 98.3°F | Resp 18

## 2021-08-18 DIAGNOSIS — R070 Pain in throat: Secondary | ICD-10-CM

## 2021-08-18 DIAGNOSIS — R0602 Shortness of breath: Secondary | ICD-10-CM

## 2021-08-18 DIAGNOSIS — R5383 Other fatigue: Secondary | ICD-10-CM

## 2021-08-18 DIAGNOSIS — R059 Cough, unspecified: Secondary | ICD-10-CM | POA: Diagnosis not present

## 2021-08-18 DIAGNOSIS — E119 Type 2 diabetes mellitus without complications: Secondary | ICD-10-CM

## 2021-08-18 DIAGNOSIS — Z8616 Personal history of COVID-19: Secondary | ICD-10-CM

## 2021-08-18 LAB — POCT FASTING CBG KUC MANUAL ENTRY: POCT Glucose (KUC): 405 mg/dL — AB (ref 70–99)

## 2021-08-18 MED ORDER — PROMETHAZINE-DM 6.25-15 MG/5ML PO SYRP: 5.0000 mL | ORAL_SOLUTION | Freq: Every evening | ORAL | 0 refills | Status: AC | PRN

## 2021-08-18 MED ORDER — GLUCOSE BLOOD VI STRP: ORAL_STRIP | 12 refills | Status: AC

## 2021-08-18 NOTE — ED Provider Notes (Signed)
[facility]   MRN: [id number] DOB: 30-Mar-1966  Subjective:   Ardeen Fillerseressa Wahlert is a 55 y.o. female presenting for recheck on ongoing sore throat, cough, fatigue and weakness.  Patient was initially seen on 08/13/2021.  She has been taking the azithromycin as prescribed.  She is also using the albuterol inhaler.  Testing results are as below as obtained from the emergency room.  She reports that she has not had much improvement at all.  Would like a refill on her diabetes test strips.  Previously had COVID-19, last episode was 2020.  She is not a smoker.  She has a diagnosis of reactive airway disease in her chart the patient is unaware about the nature of this diagnosis.  No current facility-administered medications for this encounter.  Current Outpatient Medications:    acetaminophen (TYLENOL) 500 MG tablet, Take 2 tablets (1,000 mg total) by mouth every 6 (six) hours as needed. (Patient taking differently: Take 1,000 mg by mouth every 6 (six) hours as needed for mild pain, moderate pain, fever or headache. ), Disp: 30 tablet, Rfl: 0   albuterol (VENTOLIN HFA) 108 (90 Base) MCG/ACT inhaler, Inhale 1-2 puffs into the lungs every 6 (six) hours as needed for wheezing or shortness of breath., Disp: 1 each, Rfl: 0   azithromycin (ZITHROMAX) 250 MG tablet, Take 1 tablet (250 mg total) by mouth daily. Take first 2 tablets together, then 1 every day until finished., Disp: 6 tablet, Rfl: 0   benzonatate (TESSALON) 100 MG capsule, Take 2 capsules (200 mg total) by mouth every 8 (eight) hours., Disp: 30 capsule, Rfl: 0   Canagliflozin-Metformin HCl (INVOKAMET) 150-500 MG TABS, Take 1 tablet by mouth daily., Disp: , Rfl:    cyanocobalamin 1000 MCG tablet, Take 100 mcg by mouth daily., Disp: , Rfl:    dextromethorphan-guaiFENesin (MUCINEX DM) 30-600 MG 12hr tablet, Take 1 tablet by mouth 2 (two) times daily., Disp: 20 tablet, Rfl: 0   fexofenadine (ALLEGRA) 180 MG tablet, Take 180 mg by mouth at  bedtime. , Disp: , Rfl:    FLUoxetine (PROZAC) 20 MG capsule, Take 20 mg by mouth daily., Disp: , Rfl: 3   glucose blood test strip, 1 each by Other route as needed for other. Use as instructed, Disp: , Rfl:    hydrochlorothiazide (HYDRODIURIL) 25 MG tablet, Take 25 mg by mouth daily., Disp: , Rfl: 3   ibuprofen (ADVIL,MOTRIN) 800 MG tablet, Take 1 tablet (800 mg total) by mouth 3 (three) times daily. (Patient taking differently: Take 800 mg by mouth every 6 (six) hours as needed for fever, headache, mild pain, moderate pain or cramping. ), Disp: 21 tablet, Rfl: 0   Multiple Vitamin (MULTIVITAMIN WITH MINERALS) TABS tablet, Take 1 tablet by mouth daily. menopause supplement, Disp: , Rfl:    niacin 500 MG tablet, Take 500-1,000 mg by mouth 2 (two) times daily. Take 500 mg in the morning and 1000 mgs at night, Disp: , Rfl:    ondansetron (ZOFRAN ODT) 4 MG disintegrating tablet, Take 1 tablet (4 mg total) by mouth every 8 (eight) hours as needed for nausea or vomiting., Disp: 8 tablet, Rfl: 0   potassium chloride SA (K-DUR,KLOR-CON) 20 MEQ tablet, Take 1 tablet (20 mEq total) by mouth daily., Disp: 3 tablet, Rfl: 0   pregabalin (LYRICA) 75 MG capsule, Take 75 mg by mouth 2 (two) times daily., Disp: , Rfl:    Probiotic Product (PROBIOTIC PO), Take 2 capsules by mouth daily., Disp: , Rfl:  promethazine-dextromethorphan (PROMETHAZINE-DM) 6.25-15 MG/5ML syrup, Take 5 ml by mouth every 6 hours prn for nause and vomiting, Disp: , Rfl: 0   rOPINIRole (REQUIP) 1 MG tablet, TAKE 1 TABLET BY MOUTH TWICE DAILY, Disp: 60 tablet, Rfl: 1   sitaGLIPtin (JANUVIA) 100 MG tablet, Take 100 mg by mouth daily., Disp: , Rfl:    Allergies  Allergen Reactions   Multihance [Gadobenate] Itching    After scan done pt stated she had itching throat, bendryl given, and pt monitored..   Aloe Itching and Rash    Past Medical History:  Diagnosis Date   Allergy    Anxiety    Cyst of brain    Diabetes mellitus without  complication (HCC)    Endometriosis    Hives    Hypertension    Recurrent UTI    history of   RLS (restless legs syndrome)      Past Surgical History:  Procedure Laterality Date   CERVICAL ABLATION     COMBINED HYSTEROSCOPY DIAGNOSTIC / D&C     DILATION AND CURETTAGE OF UTERUS  1989, 1995, 1996   expoloratory laporotomy     LAPAROSCOPIC ABDOMINAL EXPLORATION     NOVASURE ABLATION      Family History  Problem Relation Age of Onset   Cancer Other    Hypertension Other    Diabetes Other    Diabetes Mother    Cancer Father        melanoma   Heart attack Maternal Grandmother    Heart attack Maternal Grandfather    Cancer Paternal Grandmother    Heart attack Paternal Grandfather     Social History   Tobacco Use   Smoking status: Never   Smokeless tobacco: Never  Vaping Use   Vaping Use: Never used  Substance Use Topics   Alcohol use: Yes    Comment: rarely   Drug use: No    ROS   Objective:   Vitals: BP 130/70 (BP Location: Left Arm)   Pulse 76   Temp 98.3 F (36.8 C) (Oral)   Resp 18   LMP 09/15/2018   SpO2 96%   Physical Exam Constitutional:      General: She is not in acute distress.    Appearance: Normal appearance. She is well-developed. She is obese. She is ill-appearing. She is not toxic-appearing or diaphoretic.  HENT:     Head: Normocephalic and atraumatic.     Right Ear: External ear normal.     Left Ear: External ear normal.     Nose: Nose normal.     Mouth/Throat:     Mouth: Mucous membranes are moist.  Eyes:     Extraocular Movements: Extraocular movements intact.     Pupils: Pupils are equal, round, and reactive to light.  Cardiovascular:     Rate and Rhythm: Normal rate and regular rhythm.     Pulses: Normal pulses.     Heart sounds: Normal heart sounds. No murmur heard.   No friction rub. No gallop.  Pulmonary:     Effort: Pulmonary effort is normal. No respiratory distress.     Breath sounds: Normal breath sounds. No stridor.  No wheezing, rhonchi or rales.  Chest:     Chest wall: No tenderness.  Skin:    General: Skin is warm and dry.     Findings: No rash.  Neurological:     Mental Status: She is alert and oriented to person, place, and time.  Psychiatric:  Mood and Affect: Mood normal.        Behavior: Behavior normal.        Thought Content: Thought content normal.        Judgment: Judgment normal.    Results for orders placed or performed during the hospital encounter of 08/18/21 (from the past 24 hour(s))  POCT CBG (manual entry)     Status: Abnormal   Collection Time: 08/18/21 12:09 PM  Result Value Ref Range   POCT Glucose ([facility]) 405 (A) 70 - 99 mg/dL    DG Chest 2 View  Result Date: 08/18/2021 CLINICAL DATA:  Cough, shortness of breath EXAM: CHEST - 2 VIEW COMPARISON:  Chest radiograph dated 08/13/2021. FINDINGS: The heart size and mediastinal contours are within normal limits. Both lungs are clear. The visualized skeletal structures are unremarkable. IMPRESSION: No active cardiopulmonary disease. Electronically Signed   By: Romona Curls M.D.   On: 08/18/2021 12:34     Recent Results (from the past 2160 hour(s))  Resp Panel by RT-PCR (Flu A&B, Covid) Nasopharyngeal Swab     Status: None   Collection Time: 08/13/21  6:31 PM   Specimen: Nasopharyngeal Swab; Nasopharyngeal(NP) swabs in vial transport medium  Result Value Ref Range   SARS Coronavirus 2 by RT PCR NEGATIVE NEGATIVE    Comment: (NOTE) SARS-CoV-2 target nucleic acids are NOT DETECTED.  The SARS-CoV-2 RNA is generally detectable in upper respiratory specimens during the acute phase of infection. The lowest concentration of SARS-CoV-2 viral copies this assay can detect is 138 copies/mL. A negative result does not preclude SARS-Cov-2 infection and should not be used as the sole basis for treatment or other patient management decisions. A negative result may occur with  improper specimen collection/handling, submission of  specimen other than nasopharyngeal swab, presence of viral mutation(s) within the areas targeted by this assay, and inadequate number of viral copies(<138 copies/mL). A negative result must be combined with clinical observations, patient history, and epidemiological information. The expected result is Negative.  Fact Sheet for Patients:  BloggerCourse.com  Fact Sheet for Healthcare Providers:  SeriousBroker.it  This test is no t yet approved or cleared by the [place] FDA and  has been authorized for detection and/or diagnosis of SARS-CoV-2 by FDA under an Emergency Use Authorization (EUA). This EUA will remain  in effect (meaning this test can be used) for the duration of the COVID-19 declaration under Section 564(b)(1) of the Act, 21 U.S.C.section 360bbb-3(b)(1), unless the authorization is terminated  or revoked sooner.       Influenza A by PCR NEGATIVE NEGATIVE   Influenza B by PCR NEGATIVE NEGATIVE    Comment: (NOTE) The Xpert Xpress SARS-CoV-2/FLU/RSV plus assay is intended as an aid in the diagnosis of influenza from Nasopharyngeal swab specimens and should not be used as a sole basis for treatment. Nasal washings and aspirates are unacceptable for Xpert Xpress SARS-CoV-2/FLU/RSV testing.  Fact Sheet for Patients: BloggerCourse.com  Fact Sheet for Healthcare Providers: SeriousBroker.it  This test is not yet approved or cleared by the [place] FDA and has been authorized for detection and/or diagnosis of SARS-CoV-2 by FDA under an Emergency Use Authorization (EUA). This EUA will remain in effect (meaning this test can be used) for the duration of the COVID-19 declaration under Section 564(b)(1) of the Act, 21 U.S.C. section 360bbb-3(b)(1), unless the authorization is terminated or revoked.  Performed at [facility], [address] [place], [place] [address]      DG Chest 2  View  Result Date: 08/13/2021 CLINICAL DATA:  Sore throat and cough. EXAM: CHEST - 2 VIEW COMPARISON:  March 20, 2020 FINDINGS: Decreased lung volumes are seen with mildly increased suprahilar and infrahilar lung markings. There is no evidence of an acute infiltrate, pleural effusion or pneumothorax. The heart size and mediastinal contours are within normal limits. The visualized skeletal structures are unremarkable. IMPRESSION: Mild bilateral suprahilar and infrahilar atelectatic changes without acute infiltrate. Electronically Signed   By: Aram Candela M.D.   On: 08/13/2021 19:09     Assessment and Plan :   PDMP not reviewed this encounter.  1. Cough   2. Type 2 diabetes mellitus treated without insulin (HCC)   3. Throat pain   4. Fatigue, unspecified type   5. History of COVID-19     I reassured patient about her improved chest x-ray.  She has reassuring vital signs.  Unfortunately were not able to use steroids given her severely uncontrolled diabetes.  Recommended that she continue with supportive care, scheduled albuterol inhaler, finish azithromycin.  Provide her with a prescription for cough syrup.  Refilled her diabetes test strips.  Follow-up with her PCP for continued management of her diabetes. Counseled patient on potential for adverse effects with medications prescribed/recommended today, ER and return-to-clinic precautions discussed, patient verbalized understanding.    Wallis Bamberg, PA-C 08/18/21 1255

## 2021-08-18 NOTE — ED Triage Notes (Signed)
Pt was last seen at [facility] on 8/29 and was sent to ED due to  O2 sats. Pt reports since being seen at the ED her sxs have not improved. Cough, sore throat and HA have worsened. Pt also complains of neck and bilateral shoulder aches. She believes that she has lost weight due to decreased appetite. Confirms nausea and diarrhea in the last 24 hrs. Pt has one more day of Zithromax to complete. Pt needs a refill on her DM test strips, notes that she hasn't tested her glucose level in the past 2 wks.

## 2021-08-29 DIAGNOSIS — M792 Neuralgia and neuritis, unspecified: Secondary | ICD-10-CM | POA: Diagnosis not present

## 2021-08-29 DIAGNOSIS — Z79899 Other long term (current) drug therapy: Secondary | ICD-10-CM | POA: Diagnosis not present

## 2021-08-29 DIAGNOSIS — Z888 Allergy status to other drugs, medicaments and biological substances status: Secondary | ICD-10-CM | POA: Diagnosis not present

## 2021-08-31 DIAGNOSIS — G459 Transient cerebral ischemic attack, unspecified: Secondary | ICD-10-CM | POA: Diagnosis not present

## 2021-08-31 DIAGNOSIS — E611 Iron deficiency: Secondary | ICD-10-CM | POA: Diagnosis not present

## 2021-08-31 DIAGNOSIS — E785 Hyperlipidemia, unspecified: Secondary | ICD-10-CM | POA: Diagnosis not present

## 2021-08-31 DIAGNOSIS — K219 Gastro-esophageal reflux disease without esophagitis: Secondary | ICD-10-CM | POA: Diagnosis not present

## 2021-08-31 DIAGNOSIS — Z6841 Body Mass Index (BMI) 40.0 and over, adult: Secondary | ICD-10-CM | POA: Diagnosis not present

## 2021-08-31 DIAGNOSIS — E119 Type 2 diabetes mellitus without complications: Secondary | ICD-10-CM | POA: Diagnosis not present

## 2021-08-31 DIAGNOSIS — I10 Essential (primary) hypertension: Secondary | ICD-10-CM | POA: Diagnosis not present

## 2021-08-31 DIAGNOSIS — R058 Other specified cough: Secondary | ICD-10-CM | POA: Diagnosis not present

## 2021-09-15 DIAGNOSIS — Z419 Encounter for procedure for purposes other than remedying health state, unspecified: Secondary | ICD-10-CM | POA: Diagnosis not present

## 2021-10-16 DIAGNOSIS — Z419 Encounter for procedure for purposes other than remedying health state, unspecified: Secondary | ICD-10-CM | POA: Diagnosis not present

## 2021-11-15 DIAGNOSIS — Z419 Encounter for procedure for purposes other than remedying health state, unspecified: Secondary | ICD-10-CM | POA: Diagnosis not present

## 2021-11-30 DIAGNOSIS — Z124 Encounter for screening for malignant neoplasm of cervix: Secondary | ICD-10-CM | POA: Diagnosis not present

## 2021-11-30 DIAGNOSIS — N3000 Acute cystitis without hematuria: Secondary | ICD-10-CM | POA: Diagnosis not present

## 2021-11-30 DIAGNOSIS — F411 Generalized anxiety disorder: Secondary | ICD-10-CM | POA: Diagnosis not present

## 2021-11-30 DIAGNOSIS — E119 Type 2 diabetes mellitus without complications: Secondary | ICD-10-CM | POA: Diagnosis not present

## 2021-11-30 DIAGNOSIS — Z01419 Encounter for gynecological examination (general) (routine) without abnormal findings: Secondary | ICD-10-CM | POA: Diagnosis not present

## 2021-11-30 DIAGNOSIS — G459 Transient cerebral ischemic attack, unspecified: Secondary | ICD-10-CM | POA: Diagnosis not present

## 2021-11-30 DIAGNOSIS — E785 Hyperlipidemia, unspecified: Secondary | ICD-10-CM | POA: Diagnosis not present

## 2021-11-30 DIAGNOSIS — I10 Essential (primary) hypertension: Secondary | ICD-10-CM | POA: Diagnosis not present

## 2021-12-16 DIAGNOSIS — Z419 Encounter for procedure for purposes other than remedying health state, unspecified: Secondary | ICD-10-CM | POA: Diagnosis not present

## 2021-12-19 DIAGNOSIS — E119 Type 2 diabetes mellitus without complications: Secondary | ICD-10-CM | POA: Diagnosis not present

## 2021-12-19 DIAGNOSIS — Z7984 Long term (current) use of oral hypoglycemic drugs: Secondary | ICD-10-CM | POA: Diagnosis not present

## 2021-12-19 DIAGNOSIS — Z7985 Long-term (current) use of injectable non-insulin antidiabetic drugs: Secondary | ICD-10-CM | POA: Diagnosis not present

## 2022-01-16 DIAGNOSIS — Z419 Encounter for procedure for purposes other than remedying health state, unspecified: Secondary | ICD-10-CM | POA: Diagnosis not present

## 2022-02-13 DIAGNOSIS — Z419 Encounter for procedure for purposes other than remedying health state, unspecified: Secondary | ICD-10-CM | POA: Diagnosis not present

## 2022-02-21 DIAGNOSIS — G2581 Restless legs syndrome: Secondary | ICD-10-CM | POA: Diagnosis not present

## 2022-02-21 DIAGNOSIS — Z79899 Other long term (current) drug therapy: Secondary | ICD-10-CM | POA: Diagnosis not present

## 2022-03-16 DIAGNOSIS — Z419 Encounter for procedure for purposes other than remedying health state, unspecified: Secondary | ICD-10-CM | POA: Diagnosis not present

## 2022-04-15 DIAGNOSIS — Z419 Encounter for procedure for purposes other than remedying health state, unspecified: Secondary | ICD-10-CM | POA: Diagnosis not present

## 2022-05-16 DIAGNOSIS — Z419 Encounter for procedure for purposes other than remedying health state, unspecified: Secondary | ICD-10-CM | POA: Diagnosis not present

## 2022-06-15 DIAGNOSIS — Z419 Encounter for procedure for purposes other than remedying health state, unspecified: Secondary | ICD-10-CM | POA: Diagnosis not present

## 2022-06-30 DIAGNOSIS — E119 Type 2 diabetes mellitus without complications: Secondary | ICD-10-CM | POA: Diagnosis not present

## 2022-06-30 DIAGNOSIS — R81 Glycosuria: Secondary | ICD-10-CM | POA: Diagnosis not present

## 2022-06-30 DIAGNOSIS — R300 Dysuria: Secondary | ICD-10-CM | POA: Diagnosis not present

## 2022-06-30 DIAGNOSIS — N390 Urinary tract infection, site not specified: Secondary | ICD-10-CM | POA: Diagnosis not present

## 2022-07-02 DIAGNOSIS — R399 Unspecified symptoms and signs involving the genitourinary system: Secondary | ICD-10-CM | POA: Diagnosis not present

## 2022-07-02 DIAGNOSIS — N3000 Acute cystitis without hematuria: Secondary | ICD-10-CM | POA: Diagnosis not present

## 2022-07-02 DIAGNOSIS — M545 Low back pain, unspecified: Secondary | ICD-10-CM | POA: Diagnosis not present

## 2022-07-31 DIAGNOSIS — F430 Acute stress reaction: Secondary | ICD-10-CM | POA: Diagnosis not present

## 2022-07-31 DIAGNOSIS — F411 Generalized anxiety disorder: Secondary | ICD-10-CM | POA: Diagnosis not present

## 2022-07-31 DIAGNOSIS — Z1211 Encounter for screening for malignant neoplasm of colon: Secondary | ICD-10-CM | POA: Diagnosis not present

## 2022-07-31 DIAGNOSIS — I10 Essential (primary) hypertension: Secondary | ICD-10-CM | POA: Diagnosis not present

## 2022-07-31 DIAGNOSIS — G459 Transient cerebral ischemic attack, unspecified: Secondary | ICD-10-CM | POA: Diagnosis not present

## 2022-07-31 DIAGNOSIS — Z1231 Encounter for screening mammogram for malignant neoplasm of breast: Secondary | ICD-10-CM | POA: Diagnosis not present

## 2022-07-31 DIAGNOSIS — E611 Iron deficiency: Secondary | ICD-10-CM | POA: Diagnosis not present

## 2022-07-31 DIAGNOSIS — E785 Hyperlipidemia, unspecified: Secondary | ICD-10-CM | POA: Diagnosis not present

## 2022-07-31 DIAGNOSIS — E119 Type 2 diabetes mellitus without complications: Secondary | ICD-10-CM | POA: Diagnosis not present

## 2022-07-31 DIAGNOSIS — Z1283 Encounter for screening for malignant neoplasm of skin: Secondary | ICD-10-CM | POA: Diagnosis not present

## 2022-07-31 DIAGNOSIS — E1165 Type 2 diabetes mellitus with hyperglycemia: Secondary | ICD-10-CM | POA: Diagnosis not present

## 2022-07-31 DIAGNOSIS — M79672 Pain in left foot: Secondary | ICD-10-CM | POA: Diagnosis not present

## 2023-09-02 IMAGING — DX DG CHEST 2V
2 series · 2 of 2 positions shown · non-contrast
Comparison: Chest radiograph dated 08/13/2021.

CLINICAL DATA: Cough, shortness of breath

EXAM:
CHEST - 2 VIEW

[chest pa]
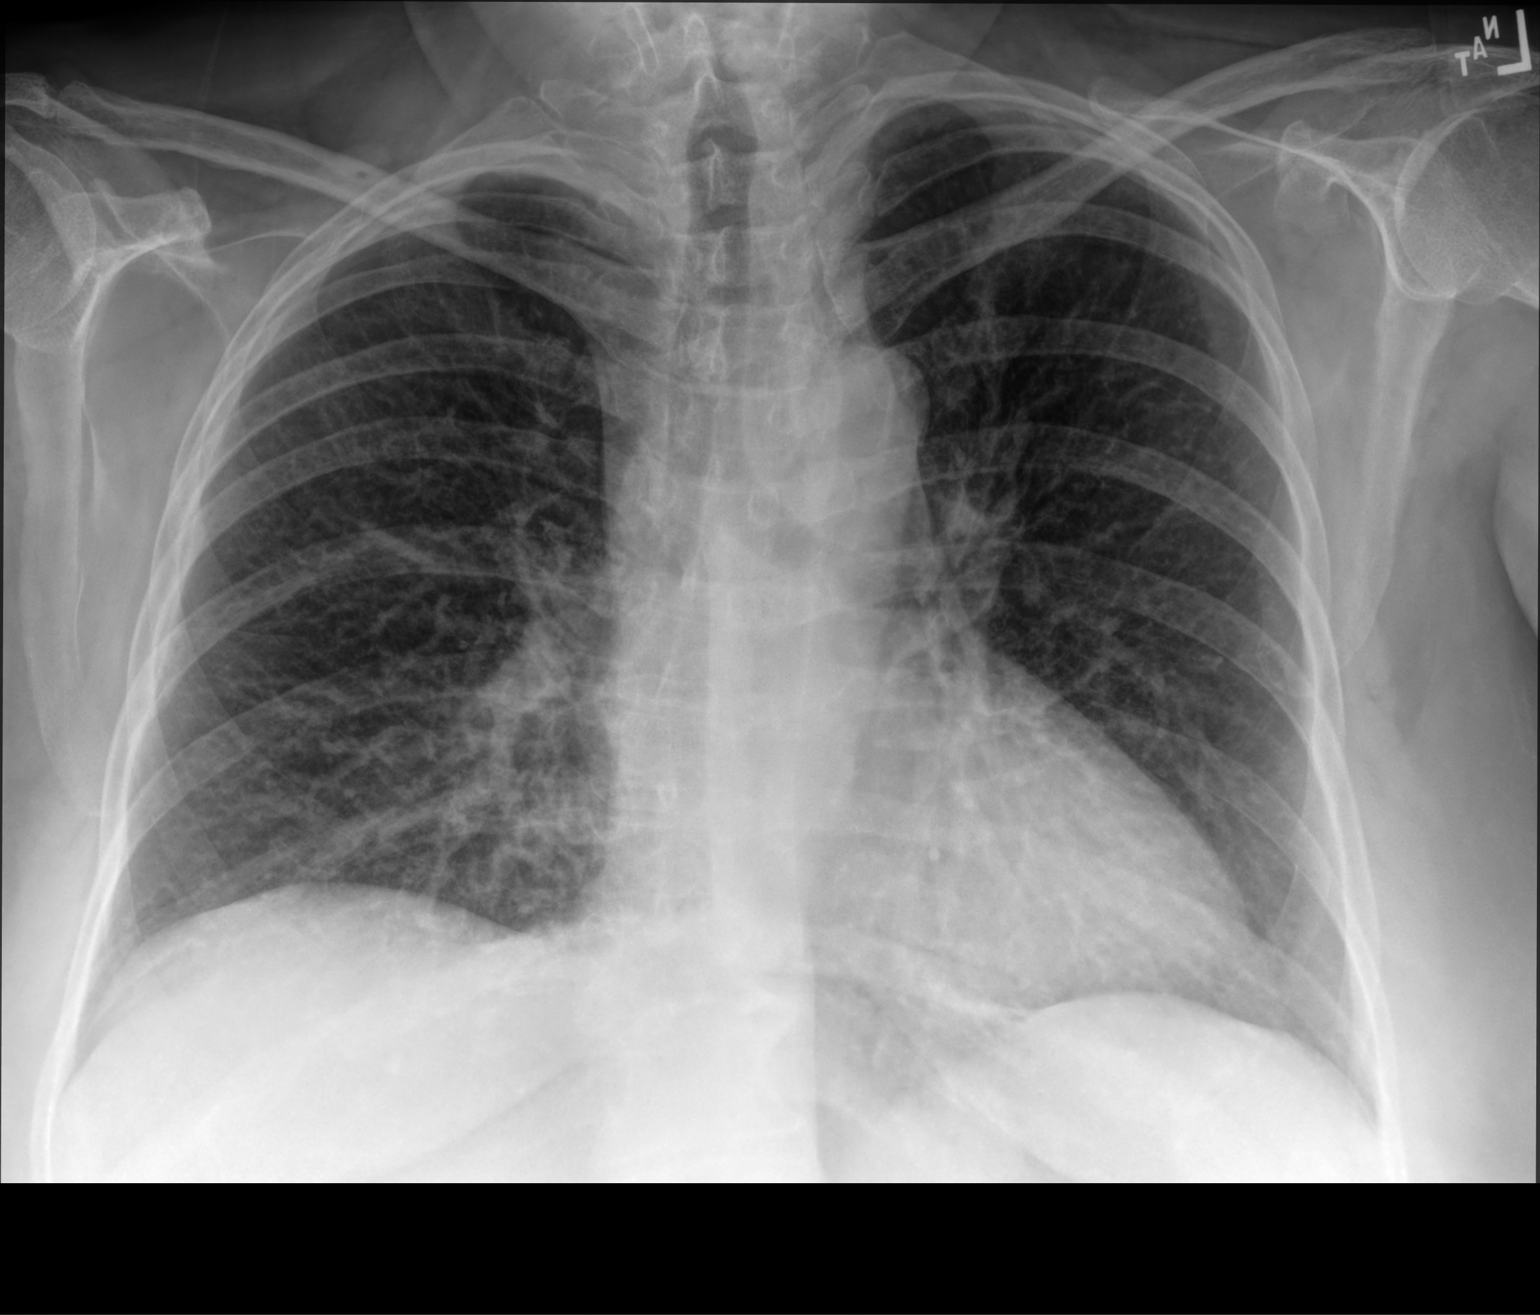

[chest lat]
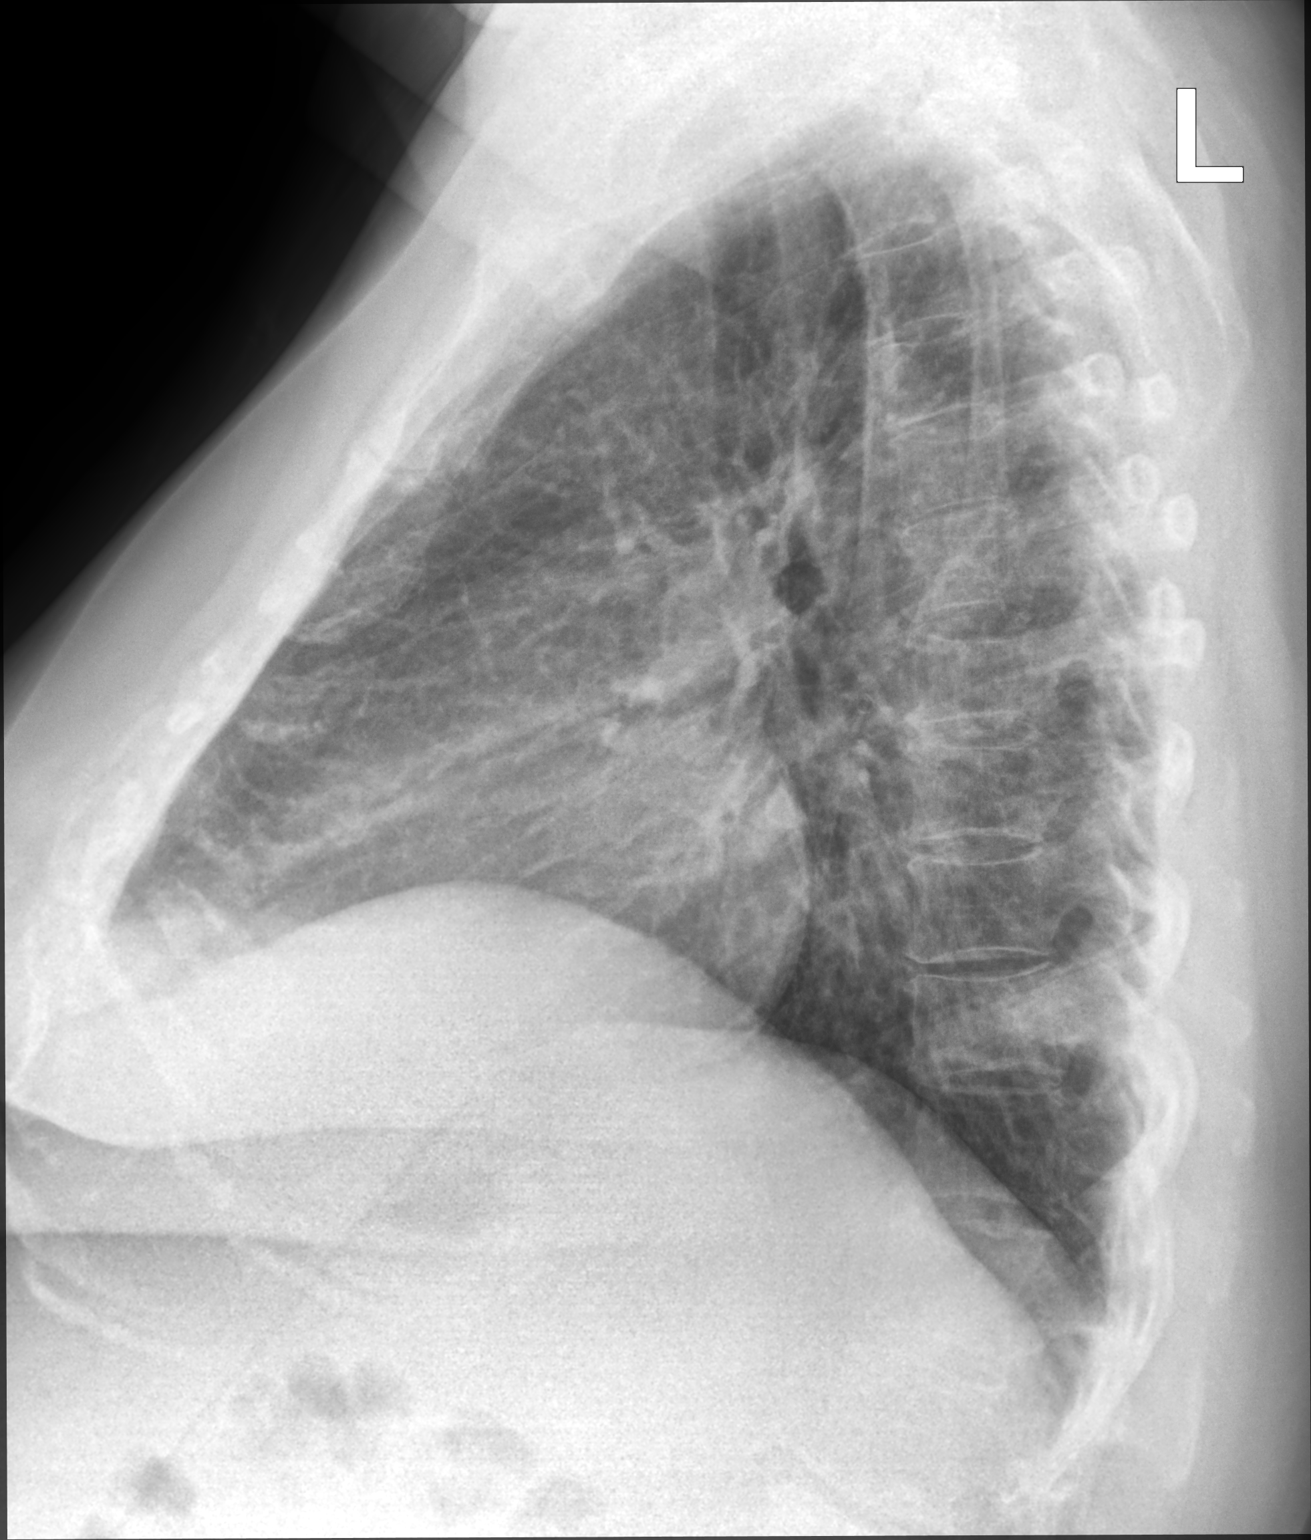

[2 of 2 positions shown; findings below may reference images not displayed]

FINDINGS: The heart size and mediastinal contours are within normal limits.
Both lungs are clear. The visualized skeletal structures are
unremarkable.
IMPRESSION: No active cardiopulmonary disease.

## 2024-01-31 ENCOUNTER — Ambulatory Visit
Admission: RE | Admit: 2024-01-31 | Discharge: 2024-01-31 | Disposition: A | Discharge status: Home / Self Care | Payer: Medicaid Other | Source: Ambulatory Visit

## 2024-01-31 VITALS — BP 121/68 | HR 78 | Temp 98.0°F | Resp 17

## 2024-01-31 DIAGNOSIS — H6993 Unspecified Eustachian tube disorder, bilateral: Secondary | ICD-10-CM

## 2024-01-31 MED ORDER — FLUTICASONE PROPIONATE 50 MCG/ACT NA SUSP: 2.0000 | Freq: Every day | NASAL | 2 refills | Status: AC

## 2024-01-31 NOTE — ED Provider Notes (Signed)
 [facility]    CSN: [id number] Arrival date & time: 01/31/24  1341      History   Chief Complaint Chief Complaint  Patient presents with   Ear Fullness    Earache - Entered by patient    HPI [name] is a 58 y.o. female.   Patient presents to urgent care for evaluation of bilateral ear discomfort that started 1 week ago. States ears feel "full" and like they need to pop. She has not felt sick with stuffy nose, sore throat, or cough recently. Denies drainage from the ears,dizziness, room spinning, fevers, chills, nausea, vomiting, and rash. History of vertigo and eustachian tube dysfunction. She takes allegra daily for allergies, otherwise has not attempted use of any OTC medicines to help with symptoms.    Ear Fullness    Past Medical History:  Diagnosis Date   Allergy    Anxiety    Cyst of brain    Diabetes mellitus without complication (HCC)    Endometriosis    Hives    Hypertension    Recurrent UTI    history of   RLS (restless legs syndrome)     Patient Active Problem List   Diagnosis Date Noted   ETD (eustachian tube dysfunction) 01/09/2014   Vertigo 12/28/2013   Abnormal CT of brain 08/24/2013   Diabetes (HCC) 08/13/2013   HTN (hypertension) 08/13/2013   Abnormality of cervix 08/13/2013   FH: melanoma 08/13/2013   Anxiety state 08/13/2013   Reactive airway disease 08/13/2013   RLS (restless legs syndrome) 08/13/2013    Past Surgical History:  Procedure Laterality Date   CERVICAL ABLATION     COMBINED HYSTEROSCOPY DIAGNOSTIC / D&C     DILATION AND CURETTAGE OF UTERUS  1989, 1995, 1996   expoloratory laporotomy     LAPAROSCOPIC ABDOMINAL EXPLORATION     NOVASURE ABLATION      OB History   No obstetric history on file.      Home Medications    Prior to Admission medications   Medication Sig Start Date End Date Taking? Authorizing Provider  fluticasone (FLONASE) 50 MCG/ACT nasal spray Place 2 sprays into both nostrils  daily. 01/31/24  Yes Carlisle Beers, FNP  glimepiride (AMARYL) 4 MG tablet Take 4 mg by mouth daily with breakfast. 09/23/23  Yes [provider]  acetaminophen (TYLENOL) 500 MG tablet Take 2 tablets (1,000 mg total) by mouth every 6 (six) hours as needed. Patient taking differently: Take 1,000 mg by mouth every 6 (six) hours as needed for mild pain, moderate pain, fever or headache.  01/10/18   Arby Barrette, MD  albuterol (VENTOLIN HFA) 108 (90 Base) MCG/ACT inhaler Inhale 1-2 puffs into the lungs every 6 (six) hours as needed for wheezing or shortness of breath. 06/04/21   Wieters, Hallie C, PA-C  azithromycin (ZITHROMAX) 250 MG tablet Take 1 tablet (250 mg total) by mouth daily. Take first 2 tablets together, then 1 every day until finished. Patient not taking: Reported on 01/31/2024 08/13/21   Nira Conn, MD  benzonatate (TESSALON) 100 MG capsule Take 2 capsules (200 mg total) by mouth every 8 (eight) hours. Patient not taking: Reported on 01/31/2024 08/13/21   Nira Conn, MD  Canagliflozin-Metformin HCl (INVOKAMET) 150-500 MG TABS Take 1 tablet by mouth daily.    [provider]  cyanocobalamin 1000 MCG tablet Take 100 mcg by mouth daily.    [provider]  dextromethorphan-guaiFENesin (MUCINEX DM) 30-600 MG 12hr tablet Take  1 tablet by mouth 2 (two) times daily. Patient not taking: Reported on 01/31/2024 06/04/21   Wieters, Hallie C, PA-C  fexofenadine (ALLEGRA) 180 MG tablet Take 180 mg by mouth at bedtime.     [provider]  FLUoxetine (PROZAC) 20 MG capsule Take 20 mg by mouth daily. 09/10/16   [provider]  glucose blood test strip 1 each by Other route as needed for other. Use as instructed    [provider]  glucose blood test strip Use as instructed 08/18/21   Wallis Bamberg, PA-C  hydrochlorothiazide (HYDRODIURIL) 25 MG tablet Take 25 mg by mouth daily. 09/05/16   [provider]  ibuprofen  (ADVIL,MOTRIN) 800 MG tablet Take 1 tablet (800 mg total) by mouth 3 (three) times daily. Patient taking differently: Take 800 mg by mouth every 6 (six) hours as needed for fever, headache, mild pain, moderate pain or cramping.  01/10/18   Arby Barrette, MD  Multiple Vitamin (MULTIVITAMIN WITH MINERALS) TABS tablet Take 1 tablet by mouth daily. menopause supplement    [provider]  niacin 500 MG tablet Take 500-1,000 mg by mouth 2 (two) times daily. Take 500 mg in the morning and 1000 mgs at night    [provider]  ondansetron (ZOFRAN ODT) 4 MG disintegrating tablet Take 1 tablet (4 mg total) by mouth every 8 (eight) hours as needed for nausea or vomiting. Patient not taking: Reported on 01/31/2024 10/05/18   Demetrios Loll T, PA-C  potassium chloride SA (K-DUR,KLOR-CON) 20 MEQ tablet Take 1 tablet (20 mEq total) by mouth daily. 02/11/18   Fayrene Helper, PA-C  pregabalin (LYRICA) 75 MG capsule Take 75 mg by mouth 2 (two) times daily.    [provider]  Probiotic Product (PROBIOTIC PO) Take 2 capsules by mouth daily.    [provider]  promethazine-dextromethorphan (PROMETHAZINE-DM) 6.25-15 MG/5ML syrup Take 5 mLs by mouth at bedtime as needed for cough. Patient not taking: Reported on 01/31/2024 08/18/21   Wallis Bamberg, PA-C  rOPINIRole (REQUIP) 1 MG tablet TAKE 1 TABLET BY MOUTH TWICE DAILY 11/08/14   Joaquim Nam, MD  sitaGLIPtin (JANUVIA) 100 MG tablet Take 100 mg by mouth daily. Patient not taking: Reported on 01/31/2024    [provider]    Family History Family History  Problem Relation Age of Onset   Cancer Other    Hypertension Other    Diabetes Other    Diabetes Mother    Cancer Father        melanoma   Heart attack Maternal Grandmother    Heart attack Maternal Grandfather    Cancer Paternal Grandmother    Heart attack Paternal Grandfather     Social History Social History   Tobacco Use   Smoking status: Never    Smokeless tobacco: Never  Vaping Use   Vaping status: Never Used  Substance Use Topics   Alcohol use: Yes    Comment: rarely   Drug use: No     Allergies   Multihance [gadobenate] and Aloe   Review of Systems Review of Systems Per HPI  Physical Exam Triage Vital Signs ED Triage Vitals  Encounter Vitals Group     BP 01/31/24 1433 121/68     Systolic BP Percentile --      Diastolic BP Percentile --      Pulse Rate 01/31/24 1433 78     Resp 01/31/24 1433 17     Temp 01/31/24 1433 98 F (36.7 C)  Temp Source 01/31/24 1433 Oral     SpO2 01/31/24 1433 93 %     Weight --      Height --      Head Circumference --      Peak Flow --      Pain Score 01/31/24 1436 4     Pain Loc --      Pain Education --      Exclude from Growth Chart --    No data found.  Updated Vital Signs BP 121/68 (BP Location: Right Arm)   Pulse 78   Temp 98 F (36.7 C) (Oral)   Resp 17   LMP 09/15/2018   SpO2 93%   Visual Acuity Right Eye Distance:   Left Eye Distance:   Bilateral Distance:    Right Eye Near:   Left Eye Near:    Bilateral Near:     Physical Exam Vitals and nursing note reviewed.  Constitutional:      Appearance: She is not ill-appearing or toxic-appearing.  HENT:     Head: Normocephalic and atraumatic.     Right Ear: Hearing, ear canal and external ear normal. A middle ear effusion is present.     Left Ear: Hearing, ear canal and external ear normal. A middle ear effusion is present.     Nose: Nose normal.     Mouth/Throat:     Lips: Pink.     Mouth: Mucous membranes are moist. No injury or oral lesions.     Dentition: Normal dentition.     Tongue: No lesions.     Pharynx: Oropharynx is clear. Uvula midline. No pharyngeal swelling, oropharyngeal exudate, posterior oropharyngeal erythema, uvula swelling or postnasal drip.     Tonsils: No tonsillar exudate.  Eyes:     General: Lids are normal. Vision grossly intact. Gaze aligned appropriately.     Extraocular  Movements: Extraocular movements intact.     Conjunctiva/sclera: Conjunctivae normal.  Neck:     Trachea: Trachea and phonation normal.  Cardiovascular:     Rate and Rhythm: Normal rate and regular rhythm.     Heart sounds: Normal heart sounds, S1 normal and S2 normal.  Pulmonary:     Effort: Pulmonary effort is normal. No respiratory distress.     Breath sounds: Normal breath sounds and air entry.  Musculoskeletal:     Cervical back: Neck supple.  Lymphadenopathy:     Cervical: No cervical adenopathy.  Skin:    General: Skin is warm and dry.     Capillary Refill: Capillary refill takes less than 2 seconds.     Findings: No rash.  Neurological:     General: No focal deficit present.     Mental Status: She is alert and oriented to person, place, and time. Mental status is at baseline.     Cranial Nerves: No dysarthria or facial asymmetry.  Psychiatric:        Mood and Affect: Mood normal.        Speech: Speech normal.        Behavior: Behavior normal.        Thought Content: Thought content normal.        Judgment: Judgment normal.      UC Treatments / Results  Labs (all labs ordered are listed, but only abnormal results are displayed) Labs Reviewed - No data to display  EKG   Radiology No results found.  Procedures Procedures (including critical care time)  Medications Ordered in UC Medications - No  data to display  Initial Impression / Assessment and Plan / UC Course  I have reviewed the triage vital signs and the nursing notes.  Pertinent labs & imaging results that were available during my care of the patient were reviewed by me and considered in my medical decision making (see chart for details).   1. Dysfunction of both eustachian tubes Presentation consistent with eustachian tube dysfunction.  No signs of otitis media, TMJ, injury.  Flonase and oral antihistamine recommended. Tylenol as needed for pain.   Counseled patient on potential for adverse  effects with medications prescribed/recommended today, strict ER and return-to-clinic precautions discussed, patient verbalized understanding.    Final Clinical Impressions(s) / UC Diagnoses   Final diagnoses:  Dysfunction of both eustachian tubes     Discharge Instructions      Your eustachian tubes are inflamed causing ear fullness/symptoms.   Flonase 2 puffs into each nostril daily.  Take oral antihistamine daily (choose one of the following: Claritin, allegra, zyrtec).   Tylenol as needed for pain.  If you develop any new or worsening symptoms or if your symptoms do not start to improve, please return here or follow-up with your primary care provider. If your symptoms are severe, please go to the emergency room.      ED Prescriptions     Medication Sig Dispense Auth. Provider   fluticasone (FLONASE) 50 MCG/ACT nasal spray Place 2 sprays into both nostrils daily. 16 g Carlisle Beers, FNP      PDMP not reviewed this encounter.   Carlisle Beers, [place] 01/31/24 1836

## 2024-01-31 NOTE — ED Triage Notes (Signed)
 Pt c/o right side ear ache that began 1 week ago.

## 2024-01-31 NOTE — Discharge Instructions (Signed)
 Your eustachian tubes are inflamed causing ear fullness/symptoms.   Flonase 2 puffs into each nostril daily.  Take oral antihistamine daily (choose one of the following: Claritin, allegra, zyrtec).   Tylenol as needed for pain.  If you develop any new or worsening symptoms or if your symptoms do not start to improve, please return here or follow-up with your primary care provider. If your symptoms are severe, please go to the emergency room.

## 2024-03-09 ENCOUNTER — Institutional Professional Consult (permissible substitution): Payer: Medicaid Other | Admitting: Adult Health

## 2024-04-29 ENCOUNTER — Ambulatory Visit: Admitting: Adult Health

## 2024-04-29 ENCOUNTER — Encounter: Payer: Self-pay | Admitting: Adult Health

## 2024-04-29 VITALS — BP 123/74 | HR 87 | Temp 98.6°F | Ht 63.0 in | Wt 240.8 lb

## 2024-04-29 DIAGNOSIS — Z8673 Personal history of transient ischemic attack (TIA), and cerebral infarction without residual deficits: Secondary | ICD-10-CM

## 2024-04-29 DIAGNOSIS — G4733 Obstructive sleep apnea (adult) (pediatric): Secondary | ICD-10-CM

## 2024-04-29 DIAGNOSIS — G2581 Restless legs syndrome: Secondary | ICD-10-CM | POA: Diagnosis not present

## 2024-04-29 NOTE — Progress Notes (Unsigned)
 @Patient  ID: [facility], female    DOB: 1966/11/10, 58 y.o.   MRN: [id number]  Chief Complaint  Patient presents with   Consult    Referring provider: Brett Camera  HPI: 58 year old female seen for sleep consult Apr 29, 2024 to establish for sleep apnea  TEST/EVENTS :   04/29/2024  Discussed the use of AI scribe software for clinical note transcription with the patient, who gave verbal consent to proceed.  History of Present Illness   [facility] is a 58 year old female presents for a sleep consult to establish for sleep apnea.  She was diagnosed with sleep apnea approximately twelve years ago and has been using CPAP therapy intermittently since then. Initially, she discontinued use due to personal reasons but resumed after her husband also began using a CPAP.  However, she has not consistently used the CPAP for the past six years, citing outdated supplies and a busy family life as contributing factors.   A sleep study conducted in 2015 indicated moderate sleep apnea with an apnea-hypopnea index (AHI) of approximately 17 events per hour.. She currently manages her RLS with Lyrica and Requip   She experiences symptoms of fatigue, snoring, apneic events, restlessness, and memory impairment. She has a history of diabetes and high blood pressure.  No use of sleep aids or history of congestive heart failure. Occasionally naps unintentionally.  Epworth score is 17 out of 24.  Typically gets sleepy if she sits down to rest, watch TV and in the evening hours  Her social history includes being a non-smoker and non-drinker. She has adopted nine children, some of whom have special needs, contributing to a high-stress environment. She has experienced a mini-stroke in the past, around 2022, and underwent a CT or MRI at that time.     Past Surgical History:  Procedure Laterality Date   CERVICAL ABLATION     COMBINED HYSTEROSCOPY DIAGNOSTIC / D&C     DILATION AND  CURETTAGE OF UTERUS  1989, 1995, 1996   expoloratory laporotomy     LAPAROSCOPIC ABDOMINAL EXPLORATION     NOVASURE ABLATION       Allergies  Allergen Reactions   Multihance  [Gadobenate] Itching    After scan done pt stated she had itching throat, bendryl given, and pt monitored..   Aloe Itching and Rash    Immunization History  Administered Date(s) Administered   Influenza,inj,Quad PF,6+ Mos 09/02/2013    Past Medical History:  Diagnosis Date   Allergy    Anxiety    Cyst of brain    Diabetes mellitus without complication (HCC)    Endometriosis    Hives    Hypertension    Recurrent UTI    history of   RLS (restless legs syndrome)     Tobacco History: Social History   Tobacco Use  Smoking Status Never  Smokeless Tobacco Never   Counseling given: Not Answered   Outpatient Medications Prior to Visit  Medication Sig Dispense Refill   acetaminophen  (TYLENOL ) 500 MG tablet Take 2 tablets (1,000 mg total) by mouth every 6 (six) hours as needed. (Patient taking differently: Take 1,000 mg by mouth every 6 (six) hours as needed for mild pain (pain score 1-3), moderate pain (pain score 4-6), fever or headache.) 30 tablet 0   albuterol  (VENTOLIN  HFA) 108 (90 Base) MCG/ACT inhaler Inhale 1-2 puffs into the lungs every 6 (six) hours as needed for wheezing or shortness of breath. 1 each 0   cyanocobalamin 1000 MCG  tablet Take 100 mcg by mouth daily.     fexofenadine (ALLEGRA) 180 MG tablet Take 180 mg by mouth at bedtime.      FLUoxetine  (PROZAC ) 20 MG capsule Take 40 mg by mouth daily.  3   fluticasone  (FLONASE ) 50 MCG/ACT nasal spray Place 2 sprays into both nostrils daily. 16 g 2   glimepiride (AMARYL) 4 MG tablet Take 4 mg by mouth daily with breakfast.     glucose blood test strip 1 each by Other route as needed for other. Use as instructed     glucose blood test strip Use as instructed 100 each 12   hydrochlorothiazide (HYDRODIURIL) 25 MG tablet Take 25 mg by mouth daily.   3   ibuprofen  (ADVIL ,MOTRIN ) 800 MG tablet Take 1 tablet (800 mg total) by mouth 3 (three) times daily. (Patient taking differently: Take 800 mg by mouth every 6 (six) hours as needed for fever, headache, mild pain (pain score 1-3), moderate pain (pain score 4-6) or cramping.) 21 tablet 0   INVOKANA 300 MG TABS tablet Take 300 mg by mouth daily.     metFORMIN (GLUCOPHAGE-XR) 500 MG 24 hr tablet Take 1,000 mg by mouth.     Multiple Vitamin (MULTIVITAMIN WITH MINERALS) TABS tablet Take 1 tablet by mouth daily. menopause supplement     niacin 500 MG tablet Take 500-1,000 mg by mouth 2 (two) times daily. Take 500 mg in the morning and 1000 mgs at night     ondansetron  (ZOFRAN  ODT) 4 MG disintegrating tablet Take 1 tablet (4 mg total) by mouth every 8 (eight) hours as needed for nausea or vomiting. 8 tablet 0   pregabalin (LYRICA) 75 MG capsule Take 75 mg by mouth 2 (two) times daily.     Probiotic Product (PROBIOTIC PO) Take 2 capsules by mouth daily.     rOPINIRole  (REQUIP ) 1 MG tablet TAKE 1 TABLET BY MOUTH TWICE DAILY 60 tablet 1   sitaGLIPtin (JANUVIA) 100 MG tablet Take 100 mg by mouth daily.     Vitamin D, Ergocalciferol, (DRISDOL) 1.25 MG (50000 UNIT) CAPS capsule Take 1 capsule by mouth once a week.     azithromycin  (ZITHROMAX ) 250 MG tablet Take 1 tablet (250 mg total) by mouth daily. Take first 2 tablets together, then 1 every day until finished. (Patient not taking: Reported on 04/29/2024) 6 tablet 0   benzonatate  (TESSALON ) 100 MG capsule Take 2 capsules (200 mg total) by mouth every 8 (eight) hours. (Patient not taking: Reported on 04/29/2024) 30 capsule 0   Canagliflozin-Metformin HCl (INVOKAMET) 150-500 MG TABS Take 1 tablet by mouth daily. (Patient not taking: Reported on 04/29/2024)     dextromethorphan-guaiFENesin  (MUCINEX  DM) 30-600 MG 12hr tablet Take 1 tablet by mouth 2 (two) times daily. (Patient not taking: Reported on 04/29/2024) 20 tablet 0   potassium chloride  SA (K-DUR,KLOR-CON )  20 MEQ tablet Take 1 tablet (20 mEq total) by mouth daily. (Patient not taking: Reported on 04/29/2024) 3 tablet 0   promethazine -dextromethorphan (PROMETHAZINE -DM) 6.25-15 MG/5ML syrup Take 5 mLs by mouth at bedtime as needed for cough. (Patient not taking: Reported on 04/29/2024) 100 mL 0   No facility-administered medications prior to visit.     Review of Systems:   Constitutional:   No  weight loss, night sweats,  Fevers, chills,+ fatigue, or  lassitude.  HEENT:   No headaches,  Difficulty swallowing,  Tooth/dental problems, or  Sore throat,  No sneezing, itching, ear ache, nasal congestion, post nasal drip,   CV:  No chest pain,  Orthopnea, PND, swelling in lower extremities, anasarca, dizziness, palpitations, syncope.   GI  No heartburn, indigestion, abdominal pain, nausea, vomiting, diarrhea, change in bowel habits, loss of appetite, bloody stools.   Resp: No shortness of breath with exertion or at rest.  No excess mucus, no productive cough,  No non-productive cough,  No coughing up of blood.  No change in color of mucus.  No wheezing.  No chest wall deformity  Skin: no rash or lesions.  GU: no dysuria, change in color of urine, no urgency or frequency.  No flank pain, no hematuria   MS:  No joint pain or swelling.  No decreased range of motion.  No back pain.    Physical Exam  BP 123/74 (BP Location: Left Arm, Patient Position: Sitting, Cuff Size: Large)   Pulse 87   Temp 98.6 F (37 C) (Temporal)   Ht 5\' 3"  (1.6 m)   Wt 240 lb 12.8 oz (109.2 kg)   LMP 09/15/2018   SpO2 95%   BMI 42.66 kg/m   GEN: A/Ox3; pleasant , NAD, well nourished    HEENT:  [place]/AT,  , NOSE-clear, THROAT-clear, no lesions, no postnasal drip or exudate noted.  Class II-III MP airway  NECK:  Supple w/ fair ROM; no JVD; normal carotid impulses w/o bruits; no thyromegaly or nodules palpated; no lymphadenopathy.    RESP  Clear  P & A; w/o, wheezes/ rales/ or rhonchi. no accessory  muscle use, no dullness to percussion  CARD:  RRR, no m/r/g, no peripheral edema, pulses intact, no cyanosis or clubbing.  GI:   Soft & nt; nml bowel sounds; no organomegaly or masses detected.   Musco: Warm bil, no deformities or joint swelling noted.   Neuro: alert, no focal deficits noted.    Skin: Warm, no lesions or rashes    Lab Results:   BMET   BNP No results found for: "BNP"  ProBNP No results found for: "PROBNP"  Imaging: No results found.  Administration History     None           No data to display          No results found for: "NITRICOXIDE"      Assessment & Plan:   No problem-specific Assessment & Plan notes found for this encounter. Assessment and Plan    Obstructive Sleep Apnea   Diagnosed 12 years ago with moderate severity, she experiences fatigue, snoring, apneic events, and restless sleep. Intermittent CPAP use is due to personal and logistical reasons. Untreated sleep apnea can lead to cardiovascular and metabolic issues like congestive heart failure, arrhythmias, stroke, diabetes, and hypertension. Treatment options include CPAP, weight loss medications like Zepbound, and the Inspire implantable device. CPAP is the gold standard, but other options may be considered based on weight and comorbidities. Consistent treatment is crucial to mitigate health risks. Zepbound's benefits and side effects, including weight loss and nausea, were discussed, emphasizing coordination with diabetes management. The Inspire device is a future option if weight goals are met and CPAP is not tolerated. A home sleep study will be ordered through [facility]. Zepbound can be discussed with primary care for potential weight management. The Inspire device will be considered if weight goals are met and CPAP is not tolerated.  Restless Legs Syndrome   Severity varies and may be exacerbated by sleep apnea and blood sugar levels. Currently  managed with Lyrica  and Requip  Improved management may be possible with effective sleep apnea treatment.  Type 2 Diabetes Mellitus   Continue follow-up with primary care.  Hypertension   Potentially linked to untreated sleep apnea. Managing sleep apnea is important to help control blood pressure levels.  Continue follow-up primary care  History of Transient Ischemic Attack   Occurred in 2022, with no current symptoms of congestive heart failure or major stroke. Managing sleep apnea is important to reduce the risk of cerebrovascular events.          Roena Clark, NP 04/29/2024

## 2024-04-29 NOTE — Patient Instructions (Addendum)
 Set up for home sleep study Work on healthy weight loss - discuss with PCP if Zepbound is an option for you.  Do not drive if sleepy  Follow up in 6 weeks and As needed  -Virtual visit .

## 2024-05-19 ENCOUNTER — Encounter

## 2024-06-23 ENCOUNTER — Ambulatory Visit

## 2024-06-23 DIAGNOSIS — G4733 Obstructive sleep apnea (adult) (pediatric): Secondary | ICD-10-CM

## 2024-07-07 DIAGNOSIS — G4739 Other sleep apnea: Secondary | ICD-10-CM | POA: Diagnosis not present

## 2024-07-30 ENCOUNTER — Encounter: Payer: Self-pay | Admitting: Adult Health

## 2024-07-30 ENCOUNTER — Telehealth (INDEPENDENT_AMBULATORY_CARE_PROVIDER_SITE_OTHER): Admitting: Adult Health

## 2024-07-30 DIAGNOSIS — Z6841 Body Mass Index (BMI) 40.0 and over, adult: Secondary | ICD-10-CM | POA: Diagnosis not present

## 2024-07-30 DIAGNOSIS — G4733 Obstructive sleep apnea (adult) (pediatric): Secondary | ICD-10-CM

## 2024-07-30 NOTE — Patient Instructions (Addendum)
 Begin CPAP therapy at bedtime, wear all night long for at least 6 or more hours Work on healthy weight loss Do not drive if sleepy Follow-up in 3 months and As needed

## 2024-07-30 NOTE — Progress Notes (Signed)
 Virtual Visit via Video Note  I connected with April Carson on 07/30/24 at  1:30 PM EDT by a video enabled telemedicine application and verified that I am speaking with the correct person using two identifiers.  Location: Patient: Home  Provider: Office    I discussed the limitations of evaluation and management by telemedicine and the availability of in person appointments. The patient expressed understanding and agreed to proceed.  History of Present Illness: 58 year old female seen for sleep consult Apr 29, 2024 to establish for sleep apnea Medical history significant for restless leg syndrome currently on Lyrica and Requip. Today's virtual visit is to review sleep study results.  Patient needed a new CPAP order last visit but unfortunately had to have a repeat sleep study in order to restart CPAP.  Home sleep study done on June 23, 2024 showed mild sleep apnea with AHI at 9/hour and SpO2 low at 75%.  Average O2 saturation was 88%  we reviewed her results in detail.  She would like to start back on CPAP.  We reviewed all treatment options. Past Medical History:  Diagnosis Date   Allergy    Anxiety    Cyst of brain    Diabetes mellitus without complication (HCC)    Endometriosis    Hives    Hypertension    Recurrent UTI    history of   RLS (restless legs syndrome)    Current Outpatient Medications on File Prior to Visit  Medication Sig Dispense Refill   acetaminophen (TYLENOL) 500 MG tablet Take 2 tablets (1,000 mg total) by mouth every 6 (six) hours as needed. (Patient taking differently: Take 1,000 mg by mouth every 6 (six) hours as needed for mild pain (pain score 1-3), moderate pain (pain score 4-6), fever or headache.) 30 tablet 0   albuterol (VENTOLIN HFA) 108 (90 Base) MCG/ACT inhaler Inhale 1-2 puffs into the lungs every 6 (six) hours as needed for wheezing or shortness of breath. 1 each 0   azithromycin (ZITHROMAX) 250 MG tablet Take 1 tablet (250 mg total) by mouth  daily. Take first 2 tablets together, then 1 every day until finished. (Patient not taking: Reported on 04/29/2024) 6 tablet 0   benzonatate (TESSALON) 100 MG capsule Take 2 capsules (200 mg total) by mouth every 8 (eight) hours. (Patient not taking: Reported on 04/29/2024) 30 capsule 0   Canagliflozin-Metformin HCl (INVOKAMET) 150-500 MG TABS Take 1 tablet by mouth daily. (Patient not taking: Reported on 04/29/2024)     cyanocobalamin 1000 MCG tablet Take 100 mcg by mouth daily.     dextromethorphan-guaiFENesin (MUCINEX DM) 30-600 MG 12hr tablet Take 1 tablet by mouth 2 (two) times daily. (Patient not taking: Reported on 04/29/2024) 20 tablet 0   fexofenadine (ALLEGRA) 180 MG tablet Take 180 mg by mouth at bedtime.      FLUoxetine (PROZAC) 20 MG capsule Take 40 mg by mouth daily.  3   fluticasone (FLONASE) 50 MCG/ACT nasal spray Place 2 sprays into both nostrils daily. 16 g 2   glimepiride (AMARYL) 4 MG tablet Take 4 mg by mouth daily with breakfast.     glucose blood test strip 1 each by Other route as needed for other. Use as instructed     glucose blood test strip Use as instructed 100 each 12   hydrochlorothiazide (HYDRODIURIL) 25 MG tablet Take 25 mg by mouth daily.  3   ibuprofen (ADVIL,MOTRIN) 800 MG tablet Take 1 tablet (800 mg total) by mouth 3 (three) times  daily. (Patient taking differently: Take 800 mg by mouth every 6 (six) hours as needed for fever, headache, mild pain (pain score 1-3), moderate pain (pain score 4-6) or cramping.) 21 tablet 0   INVOKANA 300 MG TABS tablet Take 300 mg by mouth daily.     metFORMIN (GLUCOPHAGE-XR) 500 MG 24 hr tablet Take 1,000 mg by mouth.     Multiple Vitamin (MULTIVITAMIN WITH MINERALS) TABS tablet Take 1 tablet by mouth daily. menopause supplement     niacin 500 MG tablet Take 500-1,000 mg by mouth 2 (two) times daily. Take 500 mg in the morning and 1000 mgs at night     ondansetron (ZOFRAN ODT) 4 MG disintegrating tablet Take 1 tablet (4 mg total) by  mouth every 8 (eight) hours as needed for nausea or vomiting. 8 tablet 0   potassium chloride SA (K-DUR,KLOR-CON) 20 MEQ tablet Take 1 tablet (20 mEq total) by mouth daily. (Patient not taking: Reported on 04/29/2024) 3 tablet 0   pregabalin (LYRICA) 75 MG capsule Take 75 mg by mouth 2 (two) times daily.     Probiotic Product (PROBIOTIC PO) Take 2 capsules by mouth daily.     promethazine-dextromethorphan (PROMETHAZINE-DM) 6.25-15 MG/5ML syrup Take 5 mLs by mouth at bedtime as needed for cough. (Patient not taking: Reported on 04/29/2024) 100 mL 0   rOPINIRole (REQUIP) 1 MG tablet TAKE 1 TABLET BY MOUTH TWICE DAILY 60 tablet 1   sitaGLIPtin (JANUVIA) 100 MG tablet Take 100 mg by mouth daily.     Vitamin D, Ergocalciferol, (DRISDOL) 1.25 MG (50000 UNIT) CAPS capsule Take 1 capsule by mouth once a week.     No current facility-administered medications on file prior to visit.      Observations/Objective: Appears well no acute distress  Assessment and Plan: Mild obstructive sleep apnea.  Patient has significant symptom burden.  Will begin CPAP therapy.  Begin auto CPAP 5 to 15 cm H2O.  Patient education given on CPAP care. - discussed how weight can impact sleep and risk for sleep disordered breathing - discussed options to assist with weight loss: combination of diet modification, cardiovascular and strength training exercises   - had an extensive discussion regarding the adverse health consequences related to untreated sleep disordered breathing - specifically discussed the risks for hypertension, coronary artery disease, cardiac dysrhythmias, cerebrovascular disease, and diabetes - lifestyle modification discussed   - discussed how sleep disruption can increase risk of accidents, particularly when driving - safe driving practices were discussed   Consider ONO on return visit on CPAP .   BMI 42.  Encouraged on healthy weight loss. Can discuss on return if Candidate for Zepbound .    Plan   Patient Instructions  Begin CPAP therapy at bedtime, wear all night long for at least 6 or more hours Work on healthy weight loss Do not drive if sleepy Follow-up in 3 months and As needed       Follow Up Instructions:    I discussed the assessment and treatment plan with the patient. The patient was provided an opportunity to ask questions and all were answered. The patient agreed with the plan and demonstrated an understanding of the instructions.   The patient was advised to call back or seek an in-person evaluation if the symptoms worsen or if the condition fails to improve as anticipated.  I provided 22  minutes of non-face-to-face time during this encounter.   Madelin Stank, NP
